# Patient Record
Sex: Female | Born: 1965 | Race: White | Hispanic: No | Smoking: Current every day smoker
Health system: Southern US, Community
[De-identification: ages and names within clinical notes are randomized; demographics above are authoritative.]

## PROBLEM LIST (undated history)

## (undated) DIAGNOSIS — I10 Essential (primary) hypertension: Secondary | ICD-10-CM

## (undated) DIAGNOSIS — G935 Compression of brain: Secondary | ICD-10-CM

## (undated) DIAGNOSIS — R519 Headache, unspecified: Secondary | ICD-10-CM

## (undated) DIAGNOSIS — F32A Depression, unspecified: Secondary | ICD-10-CM

## (undated) DIAGNOSIS — I2510 Atherosclerotic heart disease of native coronary artery without angina pectoris: Secondary | ICD-10-CM

## (undated) DIAGNOSIS — H9319 Tinnitus, unspecified ear: Secondary | ICD-10-CM

## (undated) DIAGNOSIS — F419 Anxiety disorder, unspecified: Secondary | ICD-10-CM

## (undated) HISTORY — PX: ABDOMINAL HYSTERECTOMY: SHX81

## (undated) HISTORY — PX: OTHER SURGICAL HISTORY: SHX169

## (undated) HISTORY — DX: Headache, unspecified: R51.9

## (undated) HISTORY — PX: BACK SURGERY: SHX140

## (undated) HISTORY — DX: Tinnitus, unspecified ear: H93.19

## (undated) HISTORY — PX: TONSILLECTOMY: SUR1361

## (undated) HISTORY — PX: CARPAL TUNNEL RELEASE: SHX101

## (undated) HISTORY — DX: Compression of brain: G93.5

---

## 1998-02-03 ENCOUNTER — Ambulatory Visit (HOSPITAL_COMMUNITY): Admission: RE | Admit: 1998-02-03 | Discharge: 1998-02-03 | Payer: Self-pay | Admitting: Gastroenterology

## 1998-08-21 ENCOUNTER — Other Ambulatory Visit: Admission: RE | Admit: 1998-08-21 | Discharge: 1998-08-21 | Payer: Self-pay | Admitting: Obstetrics and Gynecology

## 1998-09-12 ENCOUNTER — Ambulatory Visit (HOSPITAL_COMMUNITY): Admission: RE | Admit: 1998-09-12 | Discharge: 1998-09-12 | Payer: Self-pay | Admitting: Obstetrics and Gynecology

## 2000-01-13 ENCOUNTER — Emergency Department (HOSPITAL_COMMUNITY): Admission: EM | Admit: 2000-01-13 | Discharge: 2000-01-13 | Payer: Self-pay | Admitting: *Deleted

## 2000-01-13 ENCOUNTER — Encounter: Payer: Self-pay | Admitting: Emergency Medicine

## 2000-02-11 ENCOUNTER — Inpatient Hospital Stay (HOSPITAL_COMMUNITY): Admission: EM | Admit: 2000-02-11 | Discharge: 2000-02-14 | Payer: Self-pay | Admitting: Internal Medicine

## 2000-02-12 ENCOUNTER — Encounter: Payer: Self-pay | Admitting: Internal Medicine

## 2000-02-14 ENCOUNTER — Encounter: Payer: Self-pay | Admitting: Internal Medicine

## 2000-08-24 ENCOUNTER — Inpatient Hospital Stay (HOSPITAL_COMMUNITY): Admission: AD | Admit: 2000-08-24 | Discharge: 2000-08-24 | Payer: Self-pay | Admitting: Obstetrics

## 2000-10-03 ENCOUNTER — Other Ambulatory Visit: Admission: RE | Admit: 2000-10-03 | Discharge: 2000-10-03 | Payer: Self-pay | Admitting: Obstetrics and Gynecology

## 2000-10-07 ENCOUNTER — Encounter: Admission: RE | Admit: 2000-10-07 | Discharge: 2000-10-07 | Payer: Self-pay | Admitting: Obstetrics and Gynecology

## 2000-10-07 ENCOUNTER — Encounter: Payer: Self-pay | Admitting: Obstetrics and Gynecology

## 2017-07-01 ENCOUNTER — Encounter (HOSPITAL_COMMUNITY): Payer: Self-pay

## 2017-07-01 ENCOUNTER — Emergency Department (HOSPITAL_COMMUNITY)
Admission: EM | Admit: 2017-07-01 | Discharge: 2017-07-01 | Disposition: A | Discharge status: Home / Self Care | Payer: Self-pay | Attending: Emergency Medicine | Admitting: Emergency Medicine

## 2017-07-01 ENCOUNTER — Emergency Department (HOSPITAL_COMMUNITY): Payer: Self-pay

## 2017-07-01 ENCOUNTER — Other Ambulatory Visit: Payer: Self-pay

## 2017-07-01 DIAGNOSIS — I259 Chronic ischemic heart disease, unspecified: Secondary | ICD-10-CM | POA: Insufficient documentation

## 2017-07-01 DIAGNOSIS — Z79899 Other long term (current) drug therapy: Secondary | ICD-10-CM | POA: Insufficient documentation

## 2017-07-01 DIAGNOSIS — R079 Chest pain, unspecified: Secondary | ICD-10-CM | POA: Insufficient documentation

## 2017-07-01 DIAGNOSIS — I10 Essential (primary) hypertension: Secondary | ICD-10-CM | POA: Insufficient documentation

## 2017-07-01 DIAGNOSIS — Z87891 Personal history of nicotine dependence: Secondary | ICD-10-CM | POA: Insufficient documentation

## 2017-07-01 HISTORY — DX: Essential (primary) hypertension: I10

## 2017-07-01 HISTORY — DX: Atherosclerotic heart disease of native coronary artery without angina pectoris: I25.10

## 2017-07-01 LAB — CBC
HCT: 40.5 % (ref 36.0–46.0)
Hemoglobin: 13.3 g/dL (ref 12.0–15.0)
MCH: 32.4 pg (ref 26.0–34.0)
MCHC: 32.8 g/dL (ref 30.0–36.0)
MCV: 98.8 fL (ref 78.0–100.0)
Platelets: 212 10*3/uL (ref 150–400)
RBC: 4.1 MIL/uL (ref 3.87–5.11)
RDW: 12.9 % (ref 11.5–15.5)
WBC: 6.2 10*3/uL (ref 4.0–10.5)

## 2017-07-01 LAB — BASIC METABOLIC PANEL
Anion gap: 8 (ref 5–15)
BUN: 25 mg/dL — ABNORMAL HIGH (ref 6–20)
CO2: 23 mmol/L (ref 22–32)
Calcium: 9 mg/dL (ref 8.9–10.3)
Chloride: 107 mmol/L (ref 101–111)
Creatinine, Ser: 1.04 mg/dL — ABNORMAL HIGH (ref 0.44–1.00)
GFR calc Af Amer: >60 mL/min (ref >60)
GFR calc non Af Amer: >60 mL/min (ref >60)
Glucose, Bld: 94 mg/dL (ref 65–99)
Potassium: 4.1 mmol/L (ref 3.5–5.1)
Sodium: 138 mmol/L (ref 135–145)

## 2017-07-01 LAB — I-STAT TROPONIN, ED: Troponin i, poc: 0.01 ng/mL (ref 0.00–0.08)

## 2017-07-01 LAB — TROPONIN I: Troponin I: <0.03 ng/mL (ref <0.03)

## 2017-07-01 MED ORDER — IBUPROFEN 400 MG PO TABS
600.0000 mg | ORAL_TABLET | Freq: Once | ORAL | Status: AC
  Administered 2017-07-01: 600 mg via ORAL
  Filled 2017-07-01: qty 1

## 2017-07-01 MED ORDER — SODIUM CHLORIDE 0.9 % IV BOLUS (SEPSIS)
1000.0000 mL | Freq: Once | INTRAVENOUS | Status: AC
  Administered 2017-07-01: 1000 mL via INTRAVENOUS

## 2017-07-01 MED ORDER — METOCLOPRAMIDE HCL 5 MG/ML IJ SOLN
10.0000 mg | Freq: Once | INTRAMUSCULAR | Status: AC
  Administered 2017-07-01: 10 mg via INTRAVENOUS
  Filled 2017-07-01: qty 2

## 2017-07-01 MED ORDER — DIPHENHYDRAMINE HCL 50 MG/ML IJ SOLN
25.0000 mg | Freq: Once | INTRAMUSCULAR | Status: AC
  Administered 2017-07-01: 25 mg via INTRAVENOUS
  Filled 2017-07-01: qty 1

## 2017-07-01 NOTE — ED Provider Notes (Signed)
[facility] Provider Note   CSN: [id number] Arrival date & time: 07/01/17  1641     History   Chief Complaint Chief Complaint  Patient presents with  . Chest Pain    HPI Autumn Greene is a 51 y.o. female.  HPI   51 year old female with chest pain.  She has has this pain intermittently.  To be less the pain when she wakes up.  Long the pain subsides throughout the morning she goes to her typical routine.  Today it did not so she presented for evaluation.  Describes an ache in the center of her chest.  She denies any other associated symptoms such as dyspnea, diaphoresis dizziness.  Pain does not radiate.  No change with exertion.  No unusual leg pain or swelling.  Past Medical History:  Diagnosis Date  . Coronary artery disease   . Hypertension     There are no active problems to display for this patient.   Past Surgical History:  Procedure Laterality Date  . BACK SURGERY      OB History    No data available       Home Medications    Prior to Admission medications   Medication Sig Start Date End Date Taking? Authorizing Provider  buPROPion (WELLBUTRIN XL) 300 MG 24 hr tablet Take 300 mg by mouth daily. 04/16/17  Yes [provider]  estradiol (ESTRACE) 1 MG tablet Take 1.5 mg by mouth daily. 03/28/17  Yes [provider]  gabapentin (NEURONTIN) 600 MG tablet Take 600 mg by mouth 3 (three) times daily.   Yes [provider]  losartan (COZAAR) 50 MG tablet Take 50 mg by mouth every evening. 05/31/17  Yes [provider]  Menthol, Topical Analgesic, (BIOFREEZE ROLL-ON EX) Apply 1 application topically 3 (three) times daily as needed (wrist pain).   Yes [provider]  Multiple Vitamin (MULTIVITAMIN WITH MINERALS) TABS tablet Take 1 tablet by mouth daily.   Yes [provider]  Oxycodone HCl 10 MG TABS Take 10 mg by mouth every 8 (eight) hours. 06/02/17  Yes [provider]  propranolol (INDERAL) 80 MG tablet Take 80 mg by mouth 2 (two) times daily. 04/26/17  Yes [provider]  tiZANidine (ZANAFLEX) 4 MG tablet Take 4 mg by mouth daily. 04/07/17  Yes [provider]  zolpidem (AMBIEN) 10 MG tablet Take 5 mg by mouth daily.   Yes [provider]    Family History History reviewed. No pertinent family history.  Social History Social History   Tobacco Use  . Smoking status: Former Smoker    Types: Cigars  . Smokeless tobacco: Never Used  Substance Use Topics  . Alcohol use: Yes  . Drug use: No     Allergies   Lisinopril and Sulfa antibiotics   Review of Systems Review of Systems  All systems reviewed and negative, other than as noted in HPI.  Physical Exam Updated Vital Signs BP (!) 147/70   Pulse 68   Temp 98.4 F (36.9 C) (Oral)   Resp 19   Ht 4\' 11"  (1.499 m)   Wt 79.4 kg (175 lb)   SpO2 96%   BMI 35.35 kg/m   Physical Exam  Constitutional: She appears well-developed and well-nourished. No distress.  HENT:  Head: Normocephalic and atraumatic.  Eyes: Conjunctivae are normal. Right eye exhibits no discharge. Left eye exhibits no discharge.  Neck: Neck supple.  Cardiovascular: Normal rate, regular rhythm and  normal heart sounds. Exam reveals no gallop and no friction rub.  No murmur heard. Pulmonary/Chest: Effort normal and breath sounds normal. No respiratory distress.  Abdominal: Soft. She exhibits no distension. There is no tenderness.  Musculoskeletal: She exhibits no edema or tenderness.  Lower extremities symmetric as compared to each other. No calf tenderness. Negative Homan's. No palpable cords.   Neurological: She is alert.  Skin: Skin is warm and dry.  Psychiatric: She has a normal mood and affect. Her behavior is normal. Thought content normal.  Nursing note and vitals reviewed.    ED Treatments / Results  Labs (all labs ordered are listed, but only abnormal results are  displayed) Labs Reviewed  BASIC METABOLIC PANEL - Abnormal; Notable for the following components:      Result Value   BUN 25 (*)    Creatinine, Ser 1.04 (*)    All other components within normal limits  CBC  TROPONIN I  I-STAT TROPONIN, ED  I-STAT BETA HCG BLOOD, ED ([facility], [facility], [facility] ONLY)    EKG  EKG Interpretation  Date/Time:  Friday July 01 2017 16:49:35 EST Ventricular Rate:  68 PR Interval:    QRS Duration: 85 QT Interval:  415 QTC Calculation: 442 R Axis:   -12 Text Interpretation:  Sinus rhythm Left atrial enlargement Low voltage, precordial leads Probable left ventricular hypertrophy Confirmed by Raeford RazorKohut, Agostino Gorin [phone]([id number]) on 07/01/2017 5:12:22 PM       Radiology Dg Chest 2 View  Result Date: 07/01/2017 CLINICAL DATA:  Chest pain EXAM: CHEST  2 VIEW COMPARISON:  None. FINDINGS: The heart size and mediastinal contours are within normal limits. Both lungs are clear. The visualized skeletal structures are unremarkable. IMPRESSION: No active cardiopulmonary disease. Electronically Signed   By: Deatra RobinsonKevin  Herman M.D.   On: 07/01/2017 17:56    Procedures Procedures (including critical care time)  Medications Ordered in ED Medications  ibuprofen (ADVIL,MOTRIN) tablet 600 mg (600 mg Oral Given 07/01/17 1900)  metoCLOPramide (REGLAN) injection 10 mg (10 mg Intravenous Given 07/01/17 2002)  diphenhydrAMINE (BENADRYL) injection 25 mg (25 mg Intravenous Given 07/01/17 2001)  sodium chloride 0.9 % bolus 1,000 mL (0 mLs Intravenous Stopped 07/01/17 2104)     Initial Impression / Assessment and Plan / ED Course  I have reviewed the triage vital signs and the nursing notes.  Pertinent labs & imaging results that were available during my care of the patient were reviewed by me and considered in my medical decision making (see chart for details).  51 year old female with chest pain.  Seems atypical for ACS.  EKG without overt ischemic changes.  Doubt PE, dissection or other emergent  process.  Final Clinical Impressions(s) / ED Diagnoses   Final diagnoses:  Chest pain, unspecified type    ED Discharge Orders    None       Raeford RazorKohut, [place], MD 07/17/17 [phone]

## 2017-07-01 NOTE — ED Notes (Signed)
Patient transported to X-ray 

## 2017-07-01 NOTE — ED Triage Notes (Signed)
Pt from home for Chest pain after waking up. Pt has Cp on a regular basis when waking up. Pt states it normally goes away but this time it did not. Pt has had of fluid by EMS and pt dizziness then improved

## 2017-07-05 LAB — I-STAT BETA HCG BLOOD, ED (MC, WL, AP ONLY): I-stat hCG, quantitative: <5 m[IU]/mL (ref <5)

## 2019-12-25 ENCOUNTER — Other Ambulatory Visit: Payer: Self-pay | Admitting: Physician Assistant

## 2019-12-25 ENCOUNTER — Ambulatory Visit
Admission: RE | Admit: 2019-12-25 | Discharge: 2019-12-25 | Disposition: A | Discharge status: Home / Self Care | Payer: 59 | Source: Ambulatory Visit | Attending: Physician Assistant | Admitting: Physician Assistant

## 2019-12-25 DIAGNOSIS — F17200 Nicotine dependence, unspecified, uncomplicated: Secondary | ICD-10-CM

## 2019-12-25 DIAGNOSIS — R0602 Shortness of breath: Secondary | ICD-10-CM

## 2019-12-28 ENCOUNTER — Other Ambulatory Visit (HOSPITAL_COMMUNITY): Payer: Self-pay | Admitting: Respiratory Therapy

## 2019-12-28 DIAGNOSIS — F17200 Nicotine dependence, unspecified, uncomplicated: Secondary | ICD-10-CM

## 2019-12-28 DIAGNOSIS — R0602 Shortness of breath: Secondary | ICD-10-CM

## 2020-01-08 ENCOUNTER — Other Ambulatory Visit (HOSPITAL_COMMUNITY)
Admission: RE | Admit: 2020-01-08 | Discharge: 2020-01-08 | Disposition: A | Discharge status: Home / Self Care | Payer: 59 | Source: Ambulatory Visit | Attending: Physician Assistant | Admitting: Physician Assistant

## 2020-01-08 DIAGNOSIS — Z01812 Encounter for preprocedural laboratory examination: Secondary | ICD-10-CM | POA: Insufficient documentation

## 2020-01-08 DIAGNOSIS — Z20822 Contact with and (suspected) exposure to covid-19: Secondary | ICD-10-CM | POA: Insufficient documentation

## 2020-01-08 LAB — SARS CORONAVIRUS 2 (TAT 6-24 HRS): SARS Coronavirus 2: NEGATIVE

## 2020-01-10 ENCOUNTER — Other Ambulatory Visit: Payer: Self-pay

## 2020-01-10 ENCOUNTER — Ambulatory Visit (HOSPITAL_COMMUNITY)
Admission: RE | Admit: 2020-01-10 | Discharge: 2020-01-10 | Disposition: A | Discharge status: Home / Self Care | Payer: 59 | Source: Ambulatory Visit | Attending: Physician Assistant | Admitting: Physician Assistant

## 2020-01-10 MED ORDER — ALBUTEROL SULFATE (2.5 MG/3ML) 0.083% IN NEBU: 2.5000 mg | INHALATION_SOLUTION | Freq: Once | RESPIRATORY_TRACT | Status: DC

## 2020-01-31 ENCOUNTER — Inpatient Hospital Stay (HOSPITAL_COMMUNITY): Admission: RE | Admit: 2020-01-31 | Payer: 59 | Source: Ambulatory Visit

## 2020-01-31 ENCOUNTER — Encounter (HOSPITAL_COMMUNITY): Payer: Self-pay

## 2020-03-10 ENCOUNTER — Ambulatory Visit: Payer: 59 | Admitting: Adult Health

## 2020-03-31 ENCOUNTER — Ambulatory Visit: Payer: 59 | Admitting: Adult Health

## 2020-04-24 ENCOUNTER — Ambulatory Visit: Payer: 59 | Admitting: Neurology

## 2020-04-24 ENCOUNTER — Other Ambulatory Visit: Payer: Self-pay

## 2020-04-29 ENCOUNTER — Encounter: Payer: Self-pay | Admitting: Adult Health

## 2020-04-29 ENCOUNTER — Other Ambulatory Visit: Payer: Self-pay

## 2020-04-29 ENCOUNTER — Ambulatory Visit (INDEPENDENT_AMBULATORY_CARE_PROVIDER_SITE_OTHER): Payer: 59 | Admitting: Adult Health

## 2020-04-29 VITALS — BP 158/99 | HR 73 | Ht 60.0 in | Wt 167.0 lb

## 2020-04-29 DIAGNOSIS — F411 Generalized anxiety disorder: Secondary | ICD-10-CM | POA: Diagnosis not present

## 2020-04-29 DIAGNOSIS — F331 Major depressive disorder, recurrent, moderate: Secondary | ICD-10-CM | POA: Diagnosis not present

## 2020-04-29 DIAGNOSIS — G4700 Insomnia, unspecified: Secondary | ICD-10-CM

## 2020-04-29 DIAGNOSIS — F428 Other obsessive-compulsive disorder: Secondary | ICD-10-CM

## 2020-04-29 DIAGNOSIS — F4010 Social phobia, unspecified: Secondary | ICD-10-CM | POA: Diagnosis not present

## 2020-04-29 MED ORDER — BUPROPION HCL ER (XL) 300 MG PO TB24: 300.0000 mg | ORAL_TABLET | Freq: Every day | ORAL | 2 refills | Status: DC

## 2020-04-29 MED ORDER — DULOXETINE HCL 30 MG PO CPEP: 30.0000 mg | ORAL_CAPSULE | Freq: Every day | ORAL | 2 refills | Status: DC

## 2020-04-29 MED ORDER — ZOLPIDEM TARTRATE 10 MG PO TABS: 10.0000 mg | ORAL_TABLET | Freq: Every day | ORAL | 2 refills | Status: DC

## 2020-04-29 MED ORDER — BUPROPION HCL ER (XL) 150 MG PO TB24: 150.0000 mg | ORAL_TABLET | Freq: Every day | ORAL | 2 refills | Status: DC
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
## 2020-05-08 ENCOUNTER — Other Ambulatory Visit: Payer: Self-pay | Admitting: Physician Assistant

## 2020-05-08 DIAGNOSIS — Z1231 Encounter for screening mammogram for malignant neoplasm of breast: Secondary | ICD-10-CM

## 2020-05-27 ENCOUNTER — Telehealth (INDEPENDENT_AMBULATORY_CARE_PROVIDER_SITE_OTHER): Payer: 59 | Admitting: Adult Health

## 2020-05-27 ENCOUNTER — Encounter: Payer: Self-pay | Admitting: Adult Health

## 2020-05-27 DIAGNOSIS — F4010 Social phobia, unspecified: Secondary | ICD-10-CM | POA: Diagnosis not present

## 2020-05-27 DIAGNOSIS — F411 Generalized anxiety disorder: Secondary | ICD-10-CM

## 2020-05-27 DIAGNOSIS — F428 Other obsessive-compulsive disorder: Secondary | ICD-10-CM | POA: Diagnosis not present

## 2020-05-27 DIAGNOSIS — G4700 Insomnia, unspecified: Secondary | ICD-10-CM

## 2020-05-27 DIAGNOSIS — F331 Major depressive disorder, recurrent, moderate: Secondary | ICD-10-CM | POA: Diagnosis not present

## 2020-05-27 MED ORDER — DULOXETINE HCL 60 MG PO CPEP: 60.0000 mg | ORAL_CAPSULE | Freq: Every day | ORAL | 2 refills | Status: DC

## 2020-05-27 NOTE — Progress Notes (Addendum)
Autumn Greene [id number] Nov 16, 1965 54 y.o.  Virtual Visit via Video Note  I connected with pt @ on 05/27/20 at  3:40 PM EST by a video enabled telemedicine application and verified that I am speaking with the correct person using two identifiers.   I discussed the limitations of evaluation and management by telemedicine and the availability of in person appointments. The patient expressed understanding and agreed to proceed.  I discussed the assessment and treatment plan with the patient. The patient was provided an opportunity to ask questions and all were answered. The patient agreed with the plan and demonstrated an understanding of the instructions.   The patient was advised to call back or seek an in-person evaluation if the symptoms worsen or if the condition fails to improve as anticipated.  I provided 30 minutes of non-face-to-face time during this encounter.  The patient was located at home.  The provider was located at [facility].   Dorothyann Gibbs, NP   Subjective:   Patient ID:  Autumn Greene is a 54 y.o. (DOB 1966-02-19) female.  Chief Complaint: No chief complaint on file.   HPI Autumn Greene presents for follow-up of MDD, GAD, SAD, insomnia, and Obsessional thoughts.   Describes mood today as "bettert". Pleasant. Mood symptoms - reports decreased depression, anxiety, and irritability. Feels like increase in Wellbutrin has been helpful. Not feeling as low. Able to push through things a little easier. Has started painting - painting pictures for the family for Christmas. Stating "I love to paint". Worries about relationship with son. Stating "I have racing thoughts about it". He want answer phone or talk to her. Is not able to see or communicate with grandchildren. She and fiance are getting along well. Trying to work on their relationship. Would like to see a couples therapist. Has started individual therapy and has had 3 session. Improved  interest and motivation. Still not wanting to get out and do things. Taking medications as prescribed.  Energy levels improved. Active, does not have a regular exercise routine. Walking with dog.  Enjoys some usual interests and activities. Lives with fiance. Spending time with family. Has 4 sons - 29, 30, 38, and 28. Appetite adequate. Weight gain 3 pounds since last visit - 170 pounds..  Sleeps well most nights. Averages 10 hours. Not feeling as tired when getting up.  Focus and concentration difficulties - "better when painting.. Completing tasks. Managing aspects of household. 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 Denies SI or HI.  Denies AH or VH.   Review of Systems:  Review of Systems  Musculoskeletal: Negative for gait problem.  Neurological: Negative for tremors.  Psychiatric/Behavioral:       Please refer to HPI    Medications: I have reviewed the patient's current medications.  Current Outpatient Medications  Medication Sig Dispense Refill  . buPROPion (WELLBUTRIN XL) 150 MG 24 hr tablet Take 1 tablet (150 mg total) by mouth daily. 30 tablet 2  . buPROPion (WELLBUTRIN XL) 300 MG 24 hr tablet Take 1 tablet (300 mg total) by mouth daily. 30 tablet 2  . DULoxetine (CYMBALTA) 60 MG capsule Take 1 capsule (60 mg total) by mouth daily. 30 capsule 2  . estradiol (ESTRACE) 1 MG tablet Take 1.5 mg by mouth daily.  0  . gabapentin (NEURONTIN) 600 MG tablet Take 600 mg by mouth 3 (three) times daily.    Marland Kitchen losartan (COZAAR) 50 MG tablet Take 50 mg by mouth  every evening.  0  . Menthol, Topical Analgesic, (BIOFREEZE ROLL-ON EX) Apply 1 application topically 3 (three) times daily as needed (wrist pain).    . Multiple Vitamin (MULTIVITAMIN WITH MINERALS) TABS tablet Take 1 tablet by mouth daily.    . Oxycodone HCl 10 MG TABS Take 10 mg by mouth every 8 (eight) hours.  0  . propranolol (INDERAL) 80 MG tablet Take 80 mg by mouth 2 (two) times daily.  2  .  tiZANidine (ZANAFLEX) 4 MG tablet Take 4 mg by mouth daily.  2  . zolpidem (AMBIEN) 10 MG tablet Take 1 tablet (10 mg total) by mouth at bedtime. 30 tablet 2   No current facility-administered medications for this visit.    Medication Side Effects: None  Allergies:  Allergies  Allergen Reactions  . Lisinopril Anaphylaxis, Rash and Swelling  . Oxycodone-Acetaminophen Nausea And Vomiting    STOMACH ON FIRE , HURTING  . Sulfa Antibiotics Hives  . Sulfamethoxazole Rash    Past Medical History:  Diagnosis Date  . Coronary artery disease   . Hypertension     No family history on file.  Social History   Socioeconomic History  . Marital status: Legally Separated    Spouse name: Not on file  . Number of children: Not on file  . Years of education: Not on file  . Highest education level: Not on file  Occupational History  . Not on file  Tobacco Use  . Smoking status: Current Every Day Smoker    Types: Cigars  . Smokeless tobacco: Never Used  Vaping Use  . Vaping Use: Every day  Substance and Sexual Activity  . Alcohol use: Yes  . Drug use: No  . Sexual activity: Yes  Other Topics Concern  . Not on file  Social History Narrative  . Not on file   Social Determinants of Health   Financial Resource Strain:   . Difficulty of Paying Living Expenses: Not on file  Food Insecurity:   . Worried About Programme researcher, broadcasting/film/video in the Last Year: Not on file  . Ran Out of Food in the Last Year: Not on file  Transportation Needs:   . Lack of Transportation (Medical): Not on file  . Lack of Transportation (Non-Medical): Not on file  Physical Activity:   . Days of Exercise per Week: Not on file  . Minutes of Exercise per Session: Not on file  Stress:   . Feeling of Stress : Not on file  Social Connections:   . Frequency of Communication with Friends and Family: Not on file  . Frequency of Social Gatherings with Friends and Family: Not on file  . Attends Religious Services: Not on  file  . Active Member of Clubs or Organizations: Not on file  . Attends Banker Meetings: Not on file  . Marital Status: Not on file  Intimate Partner Violence:   . Fear of Current or Ex-Partner: Not on file  . Emotionally Abused: Not on file  . Physically Abused: Not on file  . Sexually Abused: Not on file    Past Medical History, Surgical history, Social history, and Family history were reviewed and updated as appropriate.   Please see review of systems for further details on the patient's review from today.   Objective:   Physical Exam:  There were no vitals taken for this visit.  Physical Exam Constitutional:      General: She is not in acute distress. Musculoskeletal:  General: No deformity.  Neurological:     Mental Status: She is alert and oriented to person, place, and time.     Coordination: Coordination normal.  Psychiatric:        Attention and Perception: Attention and perception normal. She does not perceive auditory or visual hallucinations.        Mood and Affect: Mood normal. Mood is not anxious or depressed. Affect is not labile, blunt, angry or inappropriate.        Speech: Speech normal.        Behavior: Behavior normal.        Thought Content: Thought content normal. Thought content is not paranoid or delusional. Thought content does not include homicidal or suicidal ideation. Thought content does not include homicidal or suicidal plan.        Cognition and Memory: Cognition and memory normal.        Judgment: Judgment normal.     Comments: Insight intact     Lab Review:     Component Value Date/Time   NA 138 07/01/2017 1710   K 4.1 07/01/2017 1710   CL 107 07/01/2017 1710   CO2 23 07/01/2017 1710   GLUCOSE 94 07/01/2017 1710   BUN 25 (H) 07/01/2017 1710   CREATININE 1.04 (H) 07/01/2017 1710   CALCIUM 9.0 07/01/2017 1710   GFRNONAA >60 07/01/2017 1710   GFRAA >60 07/01/2017 1710       Component Value Date/Time   WBC 6.2  07/01/2017 1710   RBC 4.10 07/01/2017 1710   HGB 13.3 07/01/2017 1710   HCT 40.5 07/01/2017 1710   PLT 212 07/01/2017 1710   MCV 98.8 07/01/2017 1710   MCH 32.4 07/01/2017 1710   MCHC 32.8 07/01/2017 1710   RDW 12.9 07/01/2017 1710    No results found for: POCLITH, LITHIUM   No results found for: PHENYTOIN, PHENOBARB, VALPROATE, CBMZ   .res Assessment: Plan:    Plan:  PDMP reviewed  1. Increase Cymbalta 30mg  to 60mg  daily 2. Wellbutrin XL 300mg  daily - denies seizure history 3. Ambien 10mg  daily  Set up with a therapist - [place].  Read and reviewed note with patient for accuracy.   RTC 4 weeks  Patient advised to contact office with any questions, adverse effects, or acute worsening in signs and symptoms.    Diagnoses and all orders for this visit:  Major depressive disorder, recurrent episode, moderate (HCC) -     DULoxetine (CYMBALTA) 60 MG capsule; Take 1 capsule (60 mg total) by mouth daily.  Generalized anxiety disorder -     DULoxetine (CYMBALTA) 60 MG capsule; Take 1 capsule (60 mg total) by mouth daily.  Obsessional thoughts  Social anxiety disorder  Insomnia, unspecified type     Please see After Visit Summary for patient specific instructions.  Future Appointments  Date Time Provider Department Center  06/16/2020  4:20 PM [facility]  07/03/2020  2:30 PM , MD [facility] None    No orders of the defined types were placed in this encounter.     -------------------------------

## 2020-06-16 ENCOUNTER — Ambulatory Visit: Payer: 59

## 2020-06-24 ENCOUNTER — Other Ambulatory Visit: Payer: Self-pay

## 2020-06-24 ENCOUNTER — Encounter: Payer: Self-pay | Admitting: Adult Health

## 2020-06-24 ENCOUNTER — Ambulatory Visit (INDEPENDENT_AMBULATORY_CARE_PROVIDER_SITE_OTHER): Payer: 59 | Admitting: Adult Health

## 2020-06-24 DIAGNOSIS — F4010 Social phobia, unspecified: Secondary | ICD-10-CM | POA: Diagnosis not present

## 2020-06-24 DIAGNOSIS — G4700 Insomnia, unspecified: Secondary | ICD-10-CM

## 2020-06-24 DIAGNOSIS — F331 Major depressive disorder, recurrent, moderate: Secondary | ICD-10-CM

## 2020-06-24 DIAGNOSIS — F411 Generalized anxiety disorder: Secondary | ICD-10-CM

## 2020-06-24 MED ORDER — BUPROPION HCL ER (XL) 300 MG PO TB24: 300.0000 mg | ORAL_TABLET | Freq: Every day | ORAL | 2 refills | Status: DC

## 2020-06-24 MED ORDER — BUPROPION HCL ER (XL) 150 MG PO TB24: 150.0000 mg | ORAL_TABLET | Freq: Every day | ORAL | 2 refills | Status: DC

## 2020-06-24 MED ORDER — ZOLPIDEM TARTRATE ER 12.5 MG PO TBCR: 12.5000 mg | EXTENDED_RELEASE_TABLET | Freq: Every evening | ORAL | 2 refills | Status: DC | PRN

## 2020-06-24 MED ORDER — DULOXETINE HCL 60 MG PO CPEP: 60.0000 mg | ORAL_CAPSULE | Freq: Every day | ORAL | 2 refills | Status: DC

## 2020-06-24 NOTE — Progress Notes (Signed)
Autumn Greene [id number] 12-12-1965 54 y.o.  Subjective:   Patient ID:  Autumn Greene is a 55 y.o. (DOB 07-13-1966) female.  Chief Complaint: No chief complaint on file.   HPI Autumn Greene presents to the office today for follow-up of MDD, GAD, SAD, insomnia, and Obsessional thoughts.   Describes mood today as "better". Pleasant. Mood symptoms - reports decreased depression, anxiety, and irritability. Stating "all in all, the changes have agreed with me". Still not wanting to be around people. Doesn't like people talking to her. Can't go to church. Concerned about loss of sleep - Ambien not working as well. Seeing therapist. Improved interest and motivation. Taking medications as prescribed.  Energy levels "ok" - has "bursts". Active, does not have a regular exercise routine. Walking with dog.  Enjoys some usual interests and activities. Lives with fiance. Spending time with family. Has 4 sons - 64, 30, 52, and 57. Appetite adequate. Weight stable - 170 pounds.  Sleeps better some nights than others. Averages 8 to 9 hours.   Focus and concentration difficulties. Completing tasks. Managing aspects of household - "the house is wreck". 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 Denies SI or HI.  Denies AH or VH.     Review of Systems:  Review of Systems  Musculoskeletal: Negative for gait problem.  Neurological: Negative for tremors.  Psychiatric/Behavioral:       Please refer to HPI    Medications: I have reviewed the patient's current medications.  Current Outpatient Medications  Medication Sig Dispense Refill  . buPROPion (WELLBUTRIN XL) 150 MG 24 hr tablet Take 1 tablet (150 mg total) by mouth daily. 30 tablet 2  . buPROPion (WELLBUTRIN XL) 300 MG 24 hr tablet Take 1 tablet (300 mg total) by mouth daily. 30 tablet 2  . DULoxetine (CYMBALTA) 60 MG capsule Take 1 capsule (60 mg total) by mouth daily. 30 capsule 2  . estradiol (ESTRACE) 1 MG  tablet Take 1.5 mg by mouth daily.  0  . gabapentin (NEURONTIN) 600 MG tablet Take 600 mg by mouth 3 (three) times daily.    Marland Kitchen losartan (COZAAR) 50 MG tablet Take 50 mg by mouth every evening.  0  . Menthol, Topical Analgesic, (BIOFREEZE ROLL-ON EX) Apply 1 application topically 3 (three) times daily as needed (wrist pain).    . Multiple Vitamin (MULTIVITAMIN WITH MINERALS) TABS tablet Take 1 tablet by mouth daily.    . Oxycodone HCl 10 MG TABS Take 10 mg by mouth every 8 (eight) hours.  0  . propranolol (INDERAL) 80 MG tablet Take 80 mg by mouth 2 (two) times daily.  2  . tiZANidine (ZANAFLEX) 4 MG tablet Take 4 mg by mouth daily.  2  . zolpidem (AMBIEN CR) 12.5 MG CR tablet Take 1 tablet (12.5 mg total) by mouth at bedtime as needed for sleep. 30 tablet 2   No current facility-administered medications for this visit.    Medication Side Effects: None  Allergies:  Allergies  Allergen Reactions  . Lisinopril Anaphylaxis, Rash and Swelling  . Oxycodone-Acetaminophen Nausea And Vomiting    STOMACH ON FIRE , HURTING  . Sulfa Antibiotics Hives  . Sulfamethoxazole Rash    Past Medical History:  Diagnosis Date  . Coronary artery disease   . Hypertension     No family history on file.  Social History   Socioeconomic History  . Marital status: Legally Separated    Spouse name: Not on file  .  Number of children: Not on file  . Years of education: Not on file  . Highest education level: Not on file  Occupational History  . Not on file  Tobacco Use  . Smoking status: Current Every Day Smoker    Types: Cigars  . Smokeless tobacco: Never Used  Vaping Use  . Vaping Use: Every day  Substance and Sexual Activity  . Alcohol use: Yes  . Drug use: No  . Sexual activity: Yes  Other Topics Concern  . Not on file  Social History Narrative  . Not on file   Social Determinants of Health   Financial Resource Strain:   . Difficulty of Paying Living Expenses: Not on file  Food  Insecurity:   . Worried About Programme researcher, broadcasting/film/video in the Last Year: Not on file  . Ran Out of Food in the Last Year: Not on file  Transportation Needs:   . Lack of Transportation (Medical): Not on file  . Lack of Transportation (Non-Medical): Not on file  Physical Activity:   . Days of Exercise per Week: Not on file  . Minutes of Exercise per Session: Not on file  Stress:   . Feeling of Stress : Not on file  Social Connections:   . Frequency of Communication with Friends and Family: Not on file  . Frequency of Social Gatherings with Friends and Family: Not on file  . Attends Religious Services: Not on file  . Active Member of Clubs or Organizations: Not on file  . Attends Banker Meetings: Not on file  . Marital Status: Not on file  Intimate Partner Violence:   . Fear of Current or Ex-Partner: Not on file  . Emotionally Abused: Not on file  . Physically Abused: Not on file  . Sexually Abused: Not on file    Past Medical History, Surgical history, Social history, and Family history were reviewed and updated as appropriate.   Please see review of systems for further details on the patient's review from today.   Objective:   Physical Exam:  There were no vitals taken for this visit.  Physical Exam Constitutional:      General: She is not in acute distress. Musculoskeletal:        General: No deformity.  Neurological:     Mental Status: She is alert and oriented to person, place, and time.     Coordination: Coordination normal.  Psychiatric:        Attention and Perception: Attention and perception normal. She does not perceive auditory or visual hallucinations.        Mood and Affect: Mood normal. Mood is not anxious or depressed. Affect is not labile, blunt, angry or inappropriate.        Speech: Speech normal.        Behavior: Behavior normal.        Thought Content: Thought content normal. Thought content is not paranoid or delusional. Thought content does  not include homicidal or suicidal ideation. Thought content does not include homicidal or suicidal plan.        Cognition and Memory: Cognition and memory normal.        Judgment: Judgment normal.     Comments: Insight intact     Lab Review:     Component Value Date/Time   NA 138 07/01/2017 1710   K 4.1 07/01/2017 1710   CL 107 07/01/2017 1710   CO2 23 07/01/2017 1710   GLUCOSE 94 07/01/2017 1710  BUN 25 (H) 07/01/2017 1710   CREATININE 1.04 (H) 07/01/2017 1710   CALCIUM 9.0 07/01/2017 1710   GFRNONAA >60 07/01/2017 1710   GFRAA >60 07/01/2017 1710       Component Value Date/Time   WBC 6.2 07/01/2017 1710   RBC 4.10 07/01/2017 1710   HGB 13.3 07/01/2017 1710   HCT 40.5 07/01/2017 1710   PLT 212 07/01/2017 1710   MCV 98.8 07/01/2017 1710   MCH 32.4 07/01/2017 1710   MCHC 32.8 07/01/2017 1710   RDW 12.9 07/01/2017 1710    No results found for: POCLITH, LITHIUM   No results found for: PHENYTOIN, PHENOBARB, VALPROATE, CBMZ   .res Assessment: Plan:    Plan:  PDMP reviewed  1. Cymbalta 60mg  daily 2. Wellbutrin XL 450mg  daily - denies seizure history 3. D/C Ambien 10mg  daily 4. Add Ambien CR 12.5mg  at hs  Set up with a therapist - [place].  Read and reviewed note with patient for accuracy.   RTC 4 weeks  Patient advised to contact office with any questions, adverse effects, or acute worsening in signs and symptoms.   Diagnoses and all orders for this visit:  Social anxiety disorder  Major depressive disorder, recurrent episode, moderate (HCC) -     buPROPion (WELLBUTRIN XL) 300 MG 24 hr tablet; Take 1 tablet (300 mg total) by mouth daily. -     DULoxetine (CYMBALTA) 60 MG capsule; Take 1 capsule (60 mg total) by mouth daily. -     buPROPion (WELLBUTRIN XL) 150 MG 24 hr tablet; Take 1 tablet (150 mg total) by mouth daily.  Generalized anxiety disorder -     DULoxetine (CYMBALTA) 60 MG capsule; Take 1 capsule (60 mg total) by mouth daily.  Insomnia,  unspecified type -     zolpidem (AMBIEN CR) 12.5 MG CR tablet; Take 1 tablet (12.5 mg total) by mouth at bedtime as needed for sleep.     Please see After Visit Summary for patient specific instructions.  Future Appointments  Date Time Provider Department Center  07/22/2020  9:30 AM , MD [facility] None  07/25/2020  4:30 PM [facility]    No orders of the defined types were placed in this encounter.   -------------------------------

## 2020-06-30 ENCOUNTER — Ambulatory Visit
Admission: RE | Admit: 2020-06-30 | Discharge: 2020-06-30 | Disposition: A | Discharge status: Home / Self Care | Payer: 59 | Source: Ambulatory Visit | Attending: Physician Assistant | Admitting: Physician Assistant

## 2020-06-30 ENCOUNTER — Other Ambulatory Visit: Payer: Self-pay | Admitting: Physician Assistant

## 2020-06-30 DIAGNOSIS — R042 Hemoptysis: Secondary | ICD-10-CM

## 2020-07-01 ENCOUNTER — Ambulatory Visit: Payer: 59 | Admitting: Neurology

## 2020-07-01 ENCOUNTER — Encounter: Payer: Self-pay | Admitting: Neurology

## 2020-07-01 ENCOUNTER — Other Ambulatory Visit: Payer: Self-pay

## 2020-07-01 VITALS — BP 136/93 | HR 60 | Ht 60.0 in | Wt 175.0 lb

## 2020-07-01 DIAGNOSIS — R519 Headache, unspecified: Secondary | ICD-10-CM | POA: Diagnosis not present

## 2020-07-01 DIAGNOSIS — M542 Cervicalgia: Secondary | ICD-10-CM | POA: Insufficient documentation

## 2020-07-01 DIAGNOSIS — G8929 Other chronic pain: Secondary | ICD-10-CM | POA: Insufficient documentation

## 2020-07-01 MED ORDER — ONDANSETRON 4 MG PO TBDP: 4.0000 mg | ORAL_TABLET | Freq: Three times a day (TID) | ORAL | 6 refills | Status: DC | PRN

## 2020-07-01 MED ORDER — SUMATRIPTAN SUCCINATE 50 MG PO TABS: 50.0000 mg | ORAL_TABLET | ORAL | 6 refills | Status: AC | PRN

## 2020-07-01 MED ORDER — ALPRAZOLAM 1 MG PO TABS: ORAL_TABLET | ORAL | 0 refills | Status: DC

## 2020-07-01 NOTE — Progress Notes (Signed)
Chief Complaint  Patient presents with  . New Patient (Initial Visit)    She is here to establish new care. History of decompression of chiari malformation by Dr. Benetta Spar Neave (09/2009). Reports having intermittent head pains that feels like a "red, hot, poker" sticking her in the head. The pain is sudden and only last for few seconds at a time.     HISTORICAL  Autumn Greene is a 54 year old female, seen in request by her primary care PA [place], [place] for evaluation of frequent headaches, history of Arnold-Chiari malformation, initial evaluation was on July 01, 2020.  I reviewed and summarized the referring note.  Past medical history Hypertension Hyperlipidemia Bipolar disorder, wellbutrin xl 450mg , cymbalta 60mg  daily  She reported a history of Arnold-Chiari malformation, presented with frequent neck pain, balance issues, mental confusion, radiating pain to bilateral shoulders, diagnosis was confirmed by imaging study, she had a posterior craniotomy for decompression in 2011 by Dr. , she reported significant improvement  Overall 2021, she began to have frequent neck pain, felt neck popping while moving her neck, will sometimes have headaches stemming from upper cervical region, spreading forward, ultimately settled at right parietal region, with light noise sensitivity, nauseous, sometimes sharp stabbing pain lasting for few seconds,  She denies gait abnormality, does report increased bilateral tinnitus, no visual loss, no limb weakness,  Lab in July 2021: CMP, creat 1.03,   MRI of cervical report in May 2019 from outside hospital, cervical spondylosis with mild to moderate canal stenosis from C4-5 through C6-7, multilevel neural foraminal stenosis,  MRI of lumbar spine, status post L4 laminectomy, partial laminectomy at L3 and 5, will see him since coursing the posterior spinal soft tissues, lumbar spondylosis worst at L4-5 with superimposed right subarticular  protrusion and facet arthrosis, producing moderate spinal stenosis, moderate bilateral foraminal narrowing  REVIEW OF SYSTEMS: Full 14 system review of systems performed and notable only for as above All other review of systems were negative.  ALLERGIES: Allergies  Allergen Reactions  . Lisinopril Anaphylaxis, Rash and Swelling  . Oxycodone-Acetaminophen Nausea And Vomiting    STOMACH ON FIRE , HURTING  . Sulfa Antibiotics Hives  . Sulfamethoxazole Rash    HOME MEDICATIONS: Current Outpatient Medications  Medication Sig Dispense Refill  . buPROPion (WELLBUTRIN XL) 150 MG 24 hr tablet Take 1 tablet (150 mg total) by mouth daily. 30 tablet 2  . buPROPion (WELLBUTRIN XL) 300 MG 24 hr tablet Take 1 tablet (300 mg total) by mouth daily. 30 tablet 2  . DULoxetine (CYMBALTA) 60 MG capsule Take 1 capsule (60 mg total) by mouth daily. 30 capsule 2  . gabapentin (NEURONTIN) 800 MG tablet Take 800 mg by mouth in the morning, at noon, in the evening, and at bedtime. Reports taking 1-4 times daily, depending on symptoms.    August 2021 losartan (COZAAR) 50 MG tablet Take 50 mg by mouth every evening.  0  . Multiple Vitamin (MULTIVITAMIN WITH MINERALS) TABS tablet Take 1 tablet by mouth daily.    . Oxycodone HCl 10 MG TABS Take 10 mg by mouth every 8 (eight) hours.  0  . propranolol (INDERAL) 80 MG tablet Take 80 mg by mouth 2 (two) times daily.  2  . tiZANidine (ZANAFLEX) 4 MG tablet Take 4 mg by mouth daily.  2  . zolpidem (AMBIEN CR) 12.5 MG CR tablet Take 1 tablet (12.5 mg total) by mouth at bedtime as needed for sleep. 30 tablet 2  . amLODipine (NORVASC) 2.5 MG  tablet Take 2.5 mg by mouth daily.    . chlorthalidone (HYGROTON) 25 MG tablet Take 25 mg by mouth every morning.    . rosuvastatin (CRESTOR) 5 MG tablet Take 5 mg by mouth 3 (three) times a week.    . telmisartan (MICARDIS) 40 MG tablet Take 40 mg by mouth daily.     No current facility-administered medications for this visit.    PAST  MEDICAL HISTORY: Past Medical History:  Diagnosis Date  . Chiari malformation type I (HCC)   . Coronary artery disease   . Head pain   . Hypertension   . Tinnitus     PAST SURGICAL HISTORY: Past Surgical History:  Procedure Laterality Date  . ABDOMINAL HYSTERECTOMY    . BACK SURGERY    . CARPAL TUNNEL RELEASE Bilateral   . chiari malformation    . thumb surgery Left   . TONSILLECTOMY      FAMILY HISTORY: Family History  Problem Relation Age of Onset  . Hypertension Mother   . Hyperlipidemia Mother   . Heart disease Father     SOCIAL HISTORY: Social History   Socioeconomic History  . Marital status: Legally Separated    Spouse name: Not on file  . Number of children: 4  . Years of education: college  . Highest education level: Bachelor's degree (e.g., BA, AB, BS)  Occupational History  . Occupation: Caregiver  Tobacco Use  . Smoking status: Current Every Day Smoker    Packs/day: 0.25    Types: Cigarettes  . Smokeless tobacco: Never Used  Vaping Use  . Vaping Use: Every day  Substance and Sexual Activity  . Alcohol use: Yes    Comment: occaional  . Drug use: No  . Sexual activity: Yes  Other Topics Concern  . Not on file  Social History Narrative   6 cups caffeine per day (Coke Zero).   Lives with significant other and his brother.   Right-handed.    Social Determinants of Health   Financial Resource Strain: Not on file  Food Insecurity: Not on file  Transportation Needs: Not on file  Physical Activity: Not on file  Stress: Not on file  Social Connections: Not on file  Intimate Partner Violence: Not on file     PHYSICAL EXAM   Vitals:   07/01/20 1420  BP: (!) 136/93  Pulse: 60  Weight: 175 lb (79.4 kg)  Height: 5' (1.524 m)   Not recorded     Body mass index is 34.18 kg/m.  PHYSICAL EXAMNIATION:  Gen: NAD, conversant, well nourised, well groomed                     Cardiovascular: Regular rate rhythm, no peripheral edema, warm,  nontender. Eyes: Conjunctivae clear without exudates or hemorrhage Neck: Supple, no carotid bruits. Pulmonary: Clear to auscultation bilaterally   NEUROLOGICAL EXAM:  MENTAL STATUS: Speech:    Speech is normal; fluent and spontaneous with normal comprehension.  Cognition:     Orientation to time, place and person     Normal recent and remote memory     Normal Attention span and concentration     Normal Language, naming, repeating,spontaneous speech     Fund of knowledge   CRANIAL NERVES: CN II: Visual fields are full to confrontation. Pupils are round equal and briskly reactive to light. CN III, IV, VI: extraocular movement are normal. No ptosis. CN V: Facial sensation is intact to light touch CN VII: Face is  symmetric with normal eye closure  CN VIII: Hearing is normal to causal conversation. CN IX, X: Phonation is normal. CN XI: Head turning and shoulder shrug are intact  MOTOR: There is no pronator drift of out-stretched arms. Muscle bulk and tone are normal. Muscle strength is normal.  REFLEXES: Reflexes are 2+ and symmetric at the biceps, triceps, knees, and ankles. Plantar responses are flexor.  SENSORY: Intact to light touch, pinprick and vibratory sensation are intact in fingers and toes.  COORDINATION: There is no trunk or limb dysmetria noted.  GAIT/STANCE: Posture is normal. Gait is steady with normal steps, base, arm swing, and turning. Heel and toe walking are normal. Tandem gait is normal.  Romberg is absent.   DIAGNOSTIC DATA (LABS, IMAGING, TESTING) - I reviewed patient records, labs, notes, testing and imaging myself where available.   ASSESSMENT AND PLAN  Autumn Greene is a 54 y.o. female   Previous history of Arnold-Chiari malformation, status post posterior craniotomy for decompression in 2011, Recurrent headache with migraine features, Neck pain  Normal neurological examination,  MRI of the brain to rule out structural  abnormality  Imitrex as needed, admixed with Zofran, Aleve   Levert Feinstein, M.D. Ph.D.  [facility] [address] [place], [place] [address] Ph: [phone] Fax: [phone]  CC:  Delma Officer, [place] [address] [place],  [place] [address]

## 2020-07-02 ENCOUNTER — Telehealth: Payer: Self-pay | Admitting: Neurology

## 2020-07-02 DIAGNOSIS — M542 Cervicalgia: Secondary | ICD-10-CM

## 2020-07-02 DIAGNOSIS — G8929 Other chronic pain: Secondary | ICD-10-CM

## 2020-07-02 DIAGNOSIS — R519 Headache, unspecified: Secondary | ICD-10-CM

## 2020-07-02 NOTE — Telephone Encounter (Signed)
[facility] pending  °

## 2020-07-03 ENCOUNTER — Ambulatory Visit: Payer: 59 | Admitting: Neurology

## 2020-07-03 NOTE — Telephone Encounter (Signed)
LVM for pt to call back about scheduling mri   [facility] auth: [id number] (exp. 07/02/20 to 09/30/20)

## 2020-07-22 ENCOUNTER — Ambulatory Visit: Payer: 59 | Admitting: Neurology

## 2020-07-22 ENCOUNTER — Other Ambulatory Visit: Payer: Self-pay | Admitting: Neurology

## 2020-07-22 MED ORDER — ALPRAZOLAM 1 MG PO TABS: ORAL_TABLET | ORAL | 0 refills | Status: DC

## 2020-07-22 NOTE — Telephone Encounter (Signed)
I sent in a prescription for alprazolam, 3 pills

## 2020-07-22 NOTE — Telephone Encounter (Signed)
x2 lvm for pt to call back.  

## 2020-07-22 NOTE — Telephone Encounter (Signed)
Patient returned my call she is scheduled at [facility] for 07/29/20. She informed me she is claustrophic and would like something to help her. She is aware to have a driver.

## 2020-07-22 NOTE — Telephone Encounter (Signed)
I called the patient and reviewed the details of the prescription with her. She verbalized understanding that she must have a driver to and from the scan.

## 2020-07-25 ENCOUNTER — Ambulatory Visit: Payer: 59

## 2020-07-28 NOTE — Telephone Encounter (Signed)
[facility] auth through [employer]: [id number] (exp. 07/28/20 to 08/26/20)

## 2020-07-28 NOTE — Telephone Encounter (Signed)
I just found out that the process for prior authorization for [facility] has changed. I did the new PA for the MRI through [employer]. They informed me that [facility] is out of network with [facility] for MRI's but [facility] is in network with [facility] for the MRI.   I tried to get a hold of the patient to informed her of this but I had to leave a voicemail informing her since we are out of network it will cost more and they would not approve it and that [facility] is in network. I canceled her appointment and will send the order to [facility] and they will reach out to the patient to schedule.

## 2020-07-28 NOTE — Telephone Encounter (Signed)
When you get a chance can you put a new MRI order in for [facility] to have a new accession number to schedule off of.

## 2020-07-29 ENCOUNTER — Other Ambulatory Visit: Payer: 59

## 2020-07-29 NOTE — Telephone Encounter (Signed)
Spoke with Dr Terrace Arabia and she gave v.o. for new MRI order which I have placed.

## 2020-07-29 NOTE — Telephone Encounter (Signed)
Noted, thank you

## 2020-07-29 NOTE — Addendum Note (Signed)
Addended by: Bertram Savin on: 07/29/2020 09:09 AM   Modules accepted: Orders

## 2020-07-29 NOTE — Telephone Encounter (Signed)
I tried to get a hold of the patient again but she didn't pick up I left her a voicemail informing her [facility] is out of network for the MRI's and that I sent the order to [facility] since they are who is in net work and they will reach out to her to schedule. And that I also canceled her appointment for today.

## 2020-07-30 NOTE — Telephone Encounter (Signed)
Scheduled at [facility] for 08/01/20.

## 2020-08-01 ENCOUNTER — Ambulatory Visit
Admission: RE | Admit: 2020-08-01 | Discharge: 2020-08-01 | Disposition: A | Discharge status: Home / Self Care | Payer: 59 | Source: Ambulatory Visit | Attending: Neurology | Admitting: Neurology

## 2020-08-01 ENCOUNTER — Other Ambulatory Visit: Payer: Self-pay

## 2020-08-01 DIAGNOSIS — M542 Cervicalgia: Secondary | ICD-10-CM

## 2020-08-01 DIAGNOSIS — G8929 Other chronic pain: Secondary | ICD-10-CM | POA: Diagnosis not present

## 2020-08-01 DIAGNOSIS — R519 Headache, unspecified: Secondary | ICD-10-CM

## 2020-08-07 ENCOUNTER — Ambulatory Visit: Payer: 59 | Admitting: Adult Health

## 2020-08-07 ENCOUNTER — Other Ambulatory Visit: Payer: Self-pay | Admitting: Physician Assistant

## 2020-08-08 ENCOUNTER — Encounter: Payer: Self-pay | Admitting: Adult Health

## 2020-08-08 ENCOUNTER — Telehealth (INDEPENDENT_AMBULATORY_CARE_PROVIDER_SITE_OTHER): Payer: 59 | Admitting: Adult Health

## 2020-08-08 DIAGNOSIS — F411 Generalized anxiety disorder: Secondary | ICD-10-CM | POA: Diagnosis not present

## 2020-08-08 DIAGNOSIS — F4010 Social phobia, unspecified: Secondary | ICD-10-CM

## 2020-08-08 DIAGNOSIS — G4700 Insomnia, unspecified: Secondary | ICD-10-CM | POA: Diagnosis not present

## 2020-08-08 DIAGNOSIS — F428 Other obsessive-compulsive disorder: Secondary | ICD-10-CM | POA: Diagnosis not present

## 2020-08-08 DIAGNOSIS — F331 Major depressive disorder, recurrent, moderate: Secondary | ICD-10-CM

## 2020-08-08 NOTE — Progress Notes (Signed)
Autumn Greene [id number] [birth year] 55 y.o.  Virtual Visit via Video Note  I connected with pt @ on 08/08/20 at  4:00 PM EST by a video enabled telemedicine application and verified that I am speaking with the correct person using two identifiers.   I discussed the limitations of evaluation and management by telemedicine and the availability of in person appointments. The patient expressed understanding and agreed to proceed.  I discussed the assessment and treatment plan with the patient. The patient was provided an opportunity to ask questions and all were answered. The patient agreed with the plan and demonstrated an understanding of the instructions.   The patient was advised to call back or seek an in-person evaluation if the symptoms worsen or if the condition fails to improve as anticipated.  I provided 30 minutes of non-face-to-face time during this encounter.  The patient was located at home.  The provider was located at [facility].   Dorothyann Gibbs, NP   Subjective:   Patient ID:  Autumn Greene is a 55 y.o. (DOB September 17, 1965) female.  Chief Complaint: No chief complaint on file.   HPI Ivan Maskell Moseman presents for follow-up of MDD, GAD, SAD, insomnia, and Obsessional thoughts.   Describes mood today as "ok". Pleasant. Mood symptoms - reports decreased depression, anxiety, and irritability. Stating "I'm doing alright". Mostly staying home. Not wanting to see anyone but her family. Has not been able to go to church - watching on TV. She did go out with granddaughter the past few days - shopping. Legs and feet swelling. Upcoming testing - has a nodule on her thyroid- feels like she gets choked on food and water. Has gained a lot of weight. Has an ultrasound scheduled for next week. Also plans to see a nutritionist. Seeing therapist. Improved interest and motivation. Taking medications as prescribed.  Energy levels "ok". Active, does not have a regular  exercise routine. Walking dog.  Enjoys some usual interests and activities. Lives with fiance. Spending time with family. Has 4 sons - 43, 30, 81, and 65. Appetite adequate. Weight stable - 170 pounds.  Sleeps better some nights than others. Averages  9 hours.   Focus and concentration difficulties. Completing tasks. Managing some aspects of household. 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 Denies SI or HI.  Denies AH or VH.   Review of Systems:  Review of Systems  Musculoskeletal: Negative for gait problem.  Neurological: Negative for tremors.  Psychiatric/Behavioral:       Please refer to HPI    Medications: I have reviewed the patient's current medications.  Current Outpatient Medications  Medication Sig Dispense Refill  . ALPRAZolam (XANAX) 1 MG tablet Take 1-2 tablets 30 minutes prior to MRI, may repeat once as needed. Must have driver. 3 tablet 0  . amLODipine (NORVASC) 2.5 MG tablet Take 2.5 mg by mouth daily.    Marland Kitchen buPROPion (WELLBUTRIN XL) 150 MG 24 hr tablet Take 1 tablet (150 mg total) by mouth daily. 30 tablet 2  . buPROPion (WELLBUTRIN XL) 300 MG 24 hr tablet Take 1 tablet (300 mg total) by mouth daily. 30 tablet 2  . chlorthalidone (HYGROTON) 25 MG tablet Take 25 mg by mouth every morning.    . DULoxetine (CYMBALTA) 60 MG capsule Take 1 capsule (60 mg total) by mouth daily. 30 capsule 2  . gabapentin (NEURONTIN) 800 MG tablet Take 800 mg by mouth in the morning, at noon, in the evening, and  at bedtime. Reports taking 1-4 times daily, depending on symptoms.    Marland Kitchen losartan (COZAAR) 50 MG tablet Take 50 mg by mouth every evening.  0  . Multiple Vitamin (MULTIVITAMIN WITH MINERALS) TABS tablet Take 1 tablet by mouth daily.    . ondansetron (ZOFRAN ODT) 4 MG disintegrating tablet Take 1 tablet (4 mg total) by mouth every 8 (eight) hours as needed. 20 tablet 6  . Oxycodone HCl 10 MG TABS Take 10 mg by mouth every 8 (eight) hours.  0  . propranolol  (INDERAL) 80 MG tablet Take 80 mg by mouth 2 (two) times daily.  2  . rosuvastatin (CRESTOR) 5 MG tablet Take 5 mg by mouth 3 (three) times a week.    . SUMAtriptan (IMITREX) 50 MG tablet Take 1 tablet (50 mg total) by mouth every 2 (two) hours as needed. May repeat in 2 hours if headache persists or recurs. 12 tablet 6  . telmisartan (MICARDIS) 40 MG tablet Take 40 mg by mouth daily.    Marland Kitchen tiZANidine (ZANAFLEX) 4 MG tablet Take 4 mg by mouth daily.  2  . zolpidem (AMBIEN CR) 12.5 MG CR tablet Take 1 tablet (12.5 mg total) by mouth at bedtime as needed for sleep. 30 tablet 2   No current facility-administered medications for this visit.    Medication Side Effects: None  Allergies:  Allergies  Allergen Reactions  . Lisinopril Anaphylaxis, Rash and Swelling    Other reaction(s): lip swelling  . Oxycodone-Acetaminophen Nausea And Vomiting    STOMACH ON FIRE , HURTING  . Sulfa Antibiotics Hives  . Atorvastatin     Other reaction(s): myalgia  . Chantix [Varenicline]     Other reaction(s): depression, headache  . Rosanil Cleanser [Sulfacetamide Sodium-Sulfur]     Other reaction(s): hives  . Sulfamethoxazole Rash    Past Medical History:  Diagnosis Date  . Chiari malformation type I (HCC)   . Coronary artery disease   . Head pain   . Hypertension   . Tinnitus     Family History  Problem Relation Age of Onset  . Hypertension Mother   . Hyperlipidemia Mother   . Heart disease Father     Social History   Socioeconomic History  . Marital status: Legally Separated    Spouse name: Not on file  . Number of children: 4  . Years of education: college  . Highest education level: Bachelor's degree (e.g., BA, AB, BS)  Occupational History  . Occupation: Caregiver  Tobacco Use  . Smoking status: Current Every Day Smoker    Packs/day: 0.25    Types: Cigarettes  . Smokeless tobacco: Never Used  Vaping Use  . Vaping Use: Every day  Substance and Sexual Activity  . Alcohol use:  Yes    Comment: occaional  . Drug use: No  . Sexual activity: Yes  Other Topics Concern  . Not on file  Social History Narrative   6 cups caffeine per day (Coke Zero).   Lives with significant other and his brother.   Right-handed.    Social Determinants of Health   Financial Resource Strain: Not on file  Food Insecurity: Not on file  Transportation Needs: Not on file  Physical Activity: Not on file  Stress: Not on file  Social Connections: Not on file  Intimate Partner Violence: Not on file    Past Medical History, Surgical history, Social history, and Family history were reviewed and updated as appropriate.   Please see review of  systems for further details on the patient's review from today.   Objective:   Physical Exam:  There were no vitals taken for this visit.  Physical Exam Constitutional:      General: She is not in acute distress. Musculoskeletal:        General: No deformity.  Neurological:     Mental Status: She is alert and oriented to person, place, and time.     Coordination: Coordination normal.  Psychiatric:        Attention and Perception: Attention and perception normal. She does not perceive auditory or visual hallucinations.        Mood and Affect: Mood normal. Mood is not anxious or depressed. Affect is not labile, blunt, angry or inappropriate.        Speech: Speech normal.        Behavior: Behavior normal.        Thought Content: Thought content normal. Thought content is not paranoid or delusional. Thought content does not include homicidal or suicidal ideation. Thought content does not include homicidal or suicidal plan.        Cognition and Memory: Cognition and memory normal.        Judgment: Judgment normal.     Comments: Insight intact     Lab Review:     Component Value Date/Time   NA 138 07/01/2017 1710   K 4.1 07/01/2017 1710   CL 107 07/01/2017 1710   CO2 23 07/01/2017 1710   GLUCOSE 94 07/01/2017 1710   BUN 25 (H)  07/01/2017 1710   CREATININE 1.04 (H) 07/01/2017 1710   CALCIUM 9.0 07/01/2017 1710   GFRNONAA >60 07/01/2017 1710   GFRAA >60 07/01/2017 1710       Component Value Date/Time   WBC 6.2 07/01/2017 1710   RBC 4.10 07/01/2017 1710   HGB 13.3 07/01/2017 1710   HCT 40.5 07/01/2017 1710   PLT 212 07/01/2017 1710   MCV 98.8 07/01/2017 1710   MCH 32.4 07/01/2017 1710   MCHC 32.8 07/01/2017 1710   RDW 12.9 07/01/2017 1710    No results found for: POCLITH, LITHIUM   No results found for: PHENYTOIN, PHENOBARB, VALPROATE, CBMZ   .res Assessment: Plan:    Plan:  PDMP reviewed  1. Cymbalta 60mg  daily 2. Wellbutrin XL 450mg  daily - denies seizure history 3. Ambien CR 12.5mg  at hs  Set up with a therapist - [place].  Read and reviewed note with patient for accuracy.   RTC 4 weeks  Patient advised to contact office with any questions, adverse effects, or acute worsening in signs and symptoms.    Diagnoses and all orders for this visit:  Obsessional thoughts  Insomnia, unspecified type  Social anxiety disorder  Generalized anxiety disorder  Major depressive disorder, recurrent episode, moderate (HCC)     Please see After Visit Summary for patient specific instructions.  Future Appointments  Date Time Provider Department Center  08/22/2020  2:20 PM [facility]  10/01/2020  2:45 PM 10/20/2020, NP [facility] None    No orders of the defined types were placed in this encounter.     -------------------------------

## 2020-08-18 ENCOUNTER — Other Ambulatory Visit: Payer: Self-pay | Admitting: Physician Assistant

## 2020-08-18 DIAGNOSIS — R490 Dysphonia: Secondary | ICD-10-CM

## 2020-08-18 DIAGNOSIS — R1310 Dysphagia, unspecified: Secondary | ICD-10-CM

## 2020-08-18 DIAGNOSIS — F17200 Nicotine dependence, unspecified, uncomplicated: Secondary | ICD-10-CM

## 2020-08-20 ENCOUNTER — Ambulatory Visit (INDEPENDENT_AMBULATORY_CARE_PROVIDER_SITE_OTHER): Payer: 59 | Admitting: Family Medicine

## 2020-08-22 ENCOUNTER — Other Ambulatory Visit: Payer: Self-pay

## 2020-08-22 ENCOUNTER — Ambulatory Visit
Admission: RE | Admit: 2020-08-22 | Discharge: 2020-08-22 | Disposition: A | Discharge status: Home / Self Care | Payer: 59 | Source: Ambulatory Visit | Attending: Physician Assistant | Admitting: Physician Assistant

## 2020-08-22 DIAGNOSIS — Z1231 Encounter for screening mammogram for malignant neoplasm of breast: Secondary | ICD-10-CM

## 2020-08-27 ENCOUNTER — Other Ambulatory Visit: Payer: 59

## 2020-08-29 ENCOUNTER — Ambulatory Visit
Admission: RE | Admit: 2020-08-29 | Discharge: 2020-08-29 | Disposition: A | Discharge status: Home / Self Care | Payer: 59 | Source: Ambulatory Visit | Attending: Physician Assistant | Admitting: Physician Assistant

## 2020-08-29 ENCOUNTER — Other Ambulatory Visit: Payer: Self-pay | Admitting: Physician Assistant

## 2020-08-29 DIAGNOSIS — R1310 Dysphagia, unspecified: Secondary | ICD-10-CM

## 2020-08-29 DIAGNOSIS — R490 Dysphonia: Secondary | ICD-10-CM

## 2020-08-29 DIAGNOSIS — F17200 Nicotine dependence, unspecified, uncomplicated: Secondary | ICD-10-CM

## 2020-09-03 ENCOUNTER — Ambulatory Visit (INDEPENDENT_AMBULATORY_CARE_PROVIDER_SITE_OTHER): Payer: 59 | Admitting: Family Medicine

## 2020-09-03 ENCOUNTER — Other Ambulatory Visit: Payer: Self-pay | Admitting: Adult Health

## 2020-09-03 DIAGNOSIS — G4700 Insomnia, unspecified: Secondary | ICD-10-CM

## 2020-09-05 ENCOUNTER — Other Ambulatory Visit: Payer: Self-pay | Admitting: Adult Health

## 2020-09-05 DIAGNOSIS — G4700 Insomnia, unspecified: Secondary | ICD-10-CM

## 2020-09-08 ENCOUNTER — Other Ambulatory Visit: Payer: Self-pay | Admitting: Adult Health

## 2020-09-08 DIAGNOSIS — G4700 Insomnia, unspecified: Secondary | ICD-10-CM

## 2020-09-09 ENCOUNTER — Other Ambulatory Visit: Payer: Self-pay | Admitting: Adult Health

## 2020-09-09 DIAGNOSIS — G4700 Insomnia, unspecified: Secondary | ICD-10-CM

## 2020-09-11 ENCOUNTER — Other Ambulatory Visit: Payer: Self-pay | Admitting: Adult Health

## 2020-09-11 DIAGNOSIS — G4700 Insomnia, unspecified: Secondary | ICD-10-CM

## 2020-09-12 ENCOUNTER — Other Ambulatory Visit: Payer: Self-pay | Admitting: Adult Health

## 2020-09-12 ENCOUNTER — Telehealth: Payer: Self-pay | Admitting: Adult Health

## 2020-09-12 DIAGNOSIS — G4700 Insomnia, unspecified: Secondary | ICD-10-CM

## 2020-09-12 MED ORDER — ZOLPIDEM TARTRATE ER 12.5 MG PO TBCR: 12.5000 mg | EXTENDED_RELEASE_TABLET | Freq: Every evening | ORAL | 2 refills | Status: DC | PRN

## 2020-09-12 MED ORDER — ZOLPIDEM TARTRATE 10 MG PO TABS
10.0000 mg | ORAL_TABLET | Freq: Every evening | ORAL | 2 refills | Status: DC | PRN
Start: 2020-09-12 — End: 2020-12-03

## 2020-09-12 NOTE — Telephone Encounter (Signed)
Addendum to 09/12/20 note 9:23 am. Patient called again at 2:39 pm today and said that 10 mg of Zolpidem does better for her. Please call to [facility] in [place].

## 2020-09-12 NOTE — Telephone Encounter (Signed)
Pt called and said that she needs a refill on her ambien cr 12.5 mg to be sent to the [facility] on [address] Rosalita Levan

## 2020-09-12 NOTE — Telephone Encounter (Signed)
Script sent  

## 2020-09-30 NOTE — Progress Notes (Signed)
Chief Complaint  Patient presents with   Follow-up    New rm, alone, states she is doing well     HISTORICAL  Autumn Greene is a 55 year old female, seen in request by her primary care PA [place], Olivia for evaluation of frequent headaches, history of Arnold-Chiari malformation, initial evaluation was on July 01, 2020.  I reviewed and summarized the referring note.  Past medical history Hypertension Hyperlipidemia Bipolar disorder, wellbutrin xl 450mg , cymbalta 60mg  daily  She reported a history of Arnold-Chiari malformation, presented with frequent neck pain, balance issues, mental confusion, radiating pain to bilateral shoulders, diagnosis was confirmed by imaging study, she had a posterior craniotomy for decompression in 2011 by Dr. , she reported significant improvement  Overall 2021, she began to have frequent neck pain, felt neck popping while moving her neck, will sometimes have headaches stemming from upper cervical region, spreading forward, ultimately settled at right parietal region, with light noise sensitivity, nauseous, sometimes sharp stabbing pain lasting for few seconds,  She denies gait abnormality, does report increased bilateral tinnitus, no visual loss, no limb weakness,  Lab in July 2021: CMP, creat 1.03,   MRI of cervical report in May 2019 from outside hospital, cervical spondylosis with mild to moderate canal stenosis from C4-5 through C6-7, multilevel neural foraminal stenosis,  MRI of lumbar spine, status post L4 laminectomy, partial laminectomy at L3 and 5, will see him since coursing the posterior spinal soft tissues, lumbar spondylosis worst at L4-5 with superimposed right subarticular protrusion and facet arthrosis, producing moderate spinal stenosis, moderate bilateral foraminal narrowing  Update October 01, 2020 SS: Here today alone, headaches are improved, on average once a week, takes Imitrex with good benefit. May take 20 minutes to  resolve. Thinks headaches improved due to less stress at home. 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 Already taking Wellbutrin, Cymbalta, gabapentin, propanolol, tizanidine, Ambien. Headaches comes up cervical area, settles mostly on right parietal area, spreads to frontal, migraine features.   Feels her neck is popping, crunching, at times. No numbness or weakness down the arms or leg. PCP referred for PT, wouldn't start until evaluated for neck issues. Reviewed prior MRI cervical spine with Dr.Yan known presence of cervical spondylosis.  MRI of the brain in January 2022 showed no significant abnormalities.  REVIEW OF SYSTEMS: Full 14 system review of systems performed and notable only for as above  All other review of systems were negative.  ALLERGIES: Allergies  Allergen Reactions   Lisinopril Anaphylaxis, Rash and Swelling    Other reaction(s): lip swelling   Oxycodone-Acetaminophen Nausea And Vomiting    STOMACH ON FIRE , HURTING   Sulfa Antibiotics Hives   Atorvastatin     Other reaction(s): myalgia   Chantix [Varenicline]     Other reaction(s): depression, headache   Rosanil Cleanser [Sulfacetamide Sodium-Sulfur]     Other reaction(s): hives   Sulfamethoxazole Rash    HOME MEDICATIONS: Current Outpatient Medications  Medication Sig Dispense Refill   ALPRAZolam (XANAX) 1 MG tablet Take 1-2 tablets 30 minutes prior to MRI, may repeat once as needed. Must have driver. 3 tablet 0   amLODipine (NORVASC) 2.5 MG tablet Take 2.5 mg by mouth daily.     buPROPion (WELLBUTRIN XL) 150 MG 24 hr tablet Take 1 tablet (150 mg total) by mouth daily. 30 tablet 2   buPROPion (WELLBUTRIN XL) 300 MG 24 hr tablet Take 1 tablet (300 mg total) by mouth daily. 30 tablet 2   chlorthalidone (HYGROTON)  25 MG tablet Take 25 mg by mouth every morning.     DULoxetine (CYMBALTA) 60 MG capsule Take 1 capsule (60 mg total) by mouth daily. 30 capsule 2   gabapentin (NEURONTIN) 800 MG  tablet Take 800 mg by mouth in the morning, at noon, in the evening, and at bedtime. Reports taking 1-4 times daily, depending on symptoms.     losartan (COZAAR) 50 MG tablet Take 50 mg by mouth every evening.  0   Multiple Vitamin (MULTIVITAMIN WITH MINERALS) TABS tablet Take 1 tablet by mouth daily.     propranolol (INDERAL) 80 MG tablet Take 80 mg by mouth 2 (two) times daily.  2   rosuvastatin (CRESTOR) 5 MG tablet Take 5 mg by mouth 3 (three) times a week.     SUMAtriptan (IMITREX) 50 MG tablet Take 1 tablet (50 mg total) by mouth every 2 (two) hours as needed. May repeat in 2 hours if headache persists or recurs. 12 tablet 6   telmisartan (MICARDIS) 40 MG tablet Take 40 mg by mouth daily.     tiZANidine (ZANAFLEX) 4 MG tablet Take 4 mg by mouth daily.  2   zolpidem (AMBIEN CR) 12.5 MG CR tablet Take 1 tablet (12.5 mg total) by mouth at bedtime as needed for sleep. 30 tablet 2   zolpidem (AMBIEN) 10 MG tablet Take 1 tablet (10 mg total) by mouth at bedtime as needed for sleep. 30 tablet 2   No current facility-administered medications for this visit.    PAST MEDICAL HISTORY: Past Medical History:  Diagnosis Date   Chiari malformation type I (HCC)    Coronary artery disease    Head pain    Hypertension    Tinnitus     PAST SURGICAL HISTORY: Past Surgical History:  Procedure Laterality Date   ABDOMINAL HYSTERECTOMY     BACK SURGERY     CARPAL TUNNEL RELEASE Bilateral    chiari malformation     thumb surgery Left    TONSILLECTOMY      FAMILY HISTORY: Family History  Problem Relation Age of Onset   Hypertension Mother    Hyperlipidemia Mother    Heart disease Father     SOCIAL HISTORY: Social History   Socioeconomic History   Marital status: Legally Separated    Spouse name: Not on file   Number of children: 4   Years of education: college   Highest education level: Bachelor's degree (e.g., BA, AB, BS)  Occupational History    Occupation: Caregiver  Tobacco Use   Smoking status: Current Every Day Smoker    Packs/day: 0.25    Types: Cigarettes   Smokeless tobacco: Never Used  Vaping Use   Vaping Use: Every day  Substance and Sexual Activity   Alcohol use: Yes    Comment: occaional   Drug use: No   Sexual activity: Yes  Other Topics Concern   Not on file  Social History Narrative   6 cups caffeine per day (Coke Zero).   Lives with significant other and his brother.   Right-handed.    Social Determinants of Health   Financial Resource Strain: Not on file  Food Insecurity: Not on file  Transportation Needs: Not on file  Physical Activity: Not on file  Stress: Not on file  Social Connections: Not on file  Intimate Partner Violence: Not on file   PHYSICAL EXAM   Vitals:   10/01/20 1459  BP: 140/87  Pulse: (!) 59  Weight: 172 lb (78  kg)  Height: 4\' 11"  (1.499 m)   Not recorded     Body mass index is 34.74 kg/m.  PHYSICAL EXAMNIATION:  Gen: NAD, conversant, well nourised, well groomed                     Cardiovascular: Regular rate rhythm, no peripheral edema, warm, nontender. Pulmonary: Clear to auscultation bilaterally   NEUROLOGICAL EXAM:  MENTAL STATUS: Speech:    Speech is normal; fluent and spontaneous with normal comprehension.  Cognition:     Orientation to time, place and person     Normal recent and remote memory     Normal Attention span and concentration     Normal Language, naming, repeating,spontaneous speech     Fund of knowledge   CRANIAL NERVES: CN II: Visual fields are full to confrontation. Pupils are round equal and briskly reactive to light. CN III, IV, VI: extraocular movement are normal. No ptosis. CN V: Facial sensation is intact to light touch CN VII: Face is symmetric with normal eye closure  CN VIII: Hearing is normal to causal conversation. CN IX, X: Phonation is normal. CN XI: Head turning and shoulder shrug are intact, full ROM of cervical  spine  MOTOR: There is no pronator drift of out-stretched arms. Muscle bulk and tone are normal. Muscle strength is normal.  REFLEXES: Reflexes are 2+ and symmetric at the biceps, triceps, knees, and ankles. Plantar responses are flexor.  SENSORY: Intact to light touch, pinprick and vibratory sensation are intact in fingers and toes.  COORDINATION: There is no trunk or limb dysmetria noted.  GAIT/STANCE: Posture is normal. Gait is steady with normal steps, base, arm swing, and turning. Heel and toe walking are normal. Tandem gait is normal.  Romberg is absent.  DIAGNOSTIC DATA (LABS, IMAGING, TESTING) - I reviewed patient records, labs, notes, testing and imaging myself where available.   ASSESSMENT AND PLAN  Dala Breault is a 56 y.o. female   1. Previous history of Arnold-Chiari malformation, status post posterior craniotomy for decompression in 2011, 2. Recurrent headache with migraine features, 3. Neck pain -Headaches improved on average 1 a week, excellent benefit with Imitrex -Normal neurological examination, -MRI of the brain was unremarkable in January 2022 -Continue Imitrex as needed, can mix with Zofran, Aleve for migraine headache -Prior MRI cervical spine has already shown cervical spondylosis, there is no radicular symptoms, encouraged to start PT, try heat to the neck, no indication for additional imaging at this time, symptoms stable -Continue routine follow-up with PCP, follow-up at our office on an as-needed basis  I spent 30 minutes of face-to-face and non-face-to-face time with patient.  This included previsit chart review, lab review, study review, order entry, electronic health record documentation, patient education.  February 2022, DNP  [facility] [address], [address] [place], [place] [phone]

## 2020-10-01 ENCOUNTER — Ambulatory Visit (INDEPENDENT_AMBULATORY_CARE_PROVIDER_SITE_OTHER): Payer: 59 | Admitting: Neurology

## 2020-10-01 ENCOUNTER — Encounter: Payer: Self-pay | Admitting: Neurology

## 2020-10-01 VITALS — BP 140/87 | HR 59 | Ht 59.0 in | Wt 172.0 lb

## 2020-10-01 DIAGNOSIS — R519 Headache, unspecified: Secondary | ICD-10-CM

## 2020-10-01 DIAGNOSIS — G8929 Other chronic pain: Secondary | ICD-10-CM | POA: Diagnosis not present

## 2020-10-01 DIAGNOSIS — M542 Cervicalgia: Secondary | ICD-10-CM

## 2020-10-01 NOTE — Patient Instructions (Signed)
Continue Imitrex for acute headache Try PT for neck issues Continue to see your PCP See you back as needed

## 2020-10-20 ENCOUNTER — Encounter (INDEPENDENT_AMBULATORY_CARE_PROVIDER_SITE_OTHER): Payer: Self-pay | Admitting: Otolaryngology

## 2020-10-20 ENCOUNTER — Ambulatory Visit (INDEPENDENT_AMBULATORY_CARE_PROVIDER_SITE_OTHER): Payer: 59 | Admitting: Otolaryngology

## 2020-10-20 ENCOUNTER — Other Ambulatory Visit: Payer: Self-pay

## 2020-10-20 VITALS — Temp 97.7°F

## 2020-10-20 DIAGNOSIS — K225 Diverticulum of esophagus, acquired: Secondary | ICD-10-CM | POA: Diagnosis not present

## 2020-10-20 DIAGNOSIS — K219 Gastro-esophageal reflux disease without esophagitis: Secondary | ICD-10-CM

## 2020-10-20 NOTE — Progress Notes (Signed)
HPI: Autumn Greene is a 55 y.o. female who presents is referred by her PCP for evaluation of newly discovered Zenker's diverticulum and also complains of her hearing is getting worse.  She was not scheduled for audiologic testing when she scheduled her appointment. Apparently she was complaining of some swallowing problems like food was getting stuck in her throat and points to the level of the cricoid cartilage.  She also noted reflux type symptoms.  She underwent a barium swallow which demonstrated a small 1 cm Zenker's.diverticulum.  This was nonobstructing. She was placed on pantoprazole 40 mg twice daily but did not tolerate this and stopped taking the pantoprazole. She is referred here for discussion of Zenker's diverticulum. I reviewed the barium swallow with the patient in the office today..  Past Medical History:  Diagnosis Date  . Chiari malformation type I (HCC)   . Coronary artery disease   . Head pain   . Hypertension   . Tinnitus    Past Surgical History:  Procedure Laterality Date  . ABDOMINAL HYSTERECTOMY    . BACK SURGERY    . CARPAL TUNNEL RELEASE Bilateral   . chiari malformation    . thumb surgery Left   . TONSILLECTOMY     Social History   Socioeconomic History  . Marital status: Legally Separated    Spouse name: Not on file  . Number of children: 4  . Years of education: college  . Highest education level: Bachelor's degree (e.g., BA, AB, BS)  Occupational History  . Occupation: Caregiver  Tobacco Use  . Smoking status: Current Every Day Smoker    Packs/day: 0.25    Types: Cigarettes  . Smokeless tobacco: Never Used  Vaping Use  . Vaping Use: Every day  Substance and Sexual Activity  . Alcohol use: Yes    Comment: occaional  . Drug use: No  . Sexual activity: Yes  Other Topics Concern  . Not on file  Social History Narrative   6 cups caffeine per day (Coke Zero).   Lives with significant other and his brother.   Right-handed.    Social  Determinants of Health   Financial Resource Strain: Not on file  Food Insecurity: Not on file  Transportation Needs: Not on file  Physical Activity: Not on file  Stress: Not on file  Social Connections: Not on file   Family History  Problem Relation Age of Onset  . Hypertension Mother   . Hyperlipidemia Mother   . Heart disease Father    Allergies  Allergen Reactions  . Lisinopril Anaphylaxis, Rash and Swelling    Other reaction(s): lip swelling  . Oxycodone-Acetaminophen Nausea And Vomiting    STOMACH ON FIRE , HURTING  . Sulfa Antibiotics Hives  . Atorvastatin     Other reaction(s): myalgia  . Chantix [Varenicline]     Other reaction(s): depression, headache  . Rosanil Cleanser [Sulfacetamide Sodium-Sulfur]     Other reaction(s): hives  . Sulfamethoxazole Rash   Prior to Admission medications   Medication Sig Start Date End Date Taking? Authorizing Provider  ALPRAZolam Prudy Feeler) 1 MG tablet Take 1-2 tablets 30 minutes prior to MRI, may repeat once as needed. Must have driver. 07/22/20   Sater, Pearletha Furl, MD  amLODipine (NORVASC) 2.5 MG tablet Take 2.5 mg by mouth daily. 05/04/20   [provider]  buPROPion (WELLBUTRIN XL) 150 MG 24 hr tablet Take 1 tablet (150 mg total) by mouth daily. 06/24/20   Mozingo, Thereasa Solo, NP  buPROPion (  WELLBUTRIN XL) 300 MG 24 hr tablet Take 1 tablet (300 mg total) by mouth daily. 06/24/20   Mozingo, Thereasa Solo, NP  chlorthalidone (HYGROTON) 25 MG tablet Take 25 mg by mouth every morning. 04/11/20   [provider]  DULoxetine (CYMBALTA) 60 MG capsule Take 1 capsule (60 mg total) by mouth daily. 06/24/20   Mozingo, Thereasa Solo, NP  gabapentin (NEURONTIN) 800 MG tablet Take 800 mg by mouth in the morning, at noon, in the evening, and at bedtime. Reports taking 1-4 times daily, depending on symptoms. 04/06/19   [provider]  losartan (COZAAR) 50 MG tablet Take 50 mg by mouth every evening. 05/31/17   [provider]  Multiple Vitamin (MULTIVITAMIN WITH MINERALS) TABS tablet Take 1 tablet by mouth daily.    [provider]  propranolol (INDERAL) 80 MG tablet Take 80 mg by mouth 2 (two) times daily. 04/26/17   [provider]  rosuvastatin (CRESTOR) 5 MG tablet Take 5 mg by mouth 3 (three) times a week. 04/23/20   [provider]  SUMAtriptan (IMITREX) 50 MG tablet Take 1 tablet (50 mg total) by mouth every 2 (two) hours as needed. May repeat in 2 hours if headache persists or recurs. 07/01/20   Levert Feinstein, MD  telmisartan (MICARDIS) 40 MG tablet Take 40 mg by mouth daily.    [provider]  tiZANidine (ZANAFLEX) 4 MG tablet Take 4 mg by mouth daily. 04/07/17   [provider]  zolpidem (AMBIEN CR) 12.5 MG CR tablet Take 1 tablet (12.5 mg total) by mouth at bedtime as needed for sleep. 09/12/20   Mozingo, Thereasa Solo, NP  zolpidem (AMBIEN) 10 MG tablet Take 1 tablet (10 mg total) by mouth at bedtime as needed for sleep. 09/12/20 10/12/20  Mozingo, Thereasa Solo, NP     Positive ROS: Otherwise negative  All other systems have been reviewed and were otherwise negative with the exception of those mentioned in the HPI and as above.  Physical Exam: Constitutional: Alert, well-appearing, no acute distress Ears: External ears without lesions or tenderness. Ear canals are clear bilaterally with intact, clear TMs bilaterally.  On hearing screening with a 1024 tuning fork she had minimal hearing loss in both ears. Nasal: External nose without lesions. Septum with minimal deformity.. Clear nasal passages bilaterally. Oral: Lips and gums without lesions. Tongue and palate mucosa without lesions. Posterior oropharynx clear.  Indirect laryngoscopy revealed a clear base of tongue epiglottis and piriform sinuses. Neck: No palpable adenopathy or masses.  No significant palpable thyroid nodules. Respiratory: Breathing comfortably  Skin: No facial/neck lesions or  rash noted.  Procedures  Assessment: Small 1 cm Zenker's diverticulum. Symptoms of laryngeal pharyngeal reflux Complaints of decreased hearing  Plan: I discussed with her concerning audiologist that can perform hearing evaluation that take her insurance.  Also discussed whether the only treatment options for hearing loss if discovered would be hearing aid use. Concerning the Zenker's diverticulum this is small and not causing any obstructive symptoms.  I generally would not recommend intervention on a Zenker's diverticulum unless it was 2 and half centimeters or larger. Concerning her globus type symptoms would recommend use of PPI and suggested maybe switching it to once a day before dinner in order to cover nighttime where people have more reflux while supine.  Could consider use of over-the-counter Prilosec versus Protonix which she did not feel like she tolerated well.   Narda Bonds, MD   CC:

## 2020-11-12 ENCOUNTER — Other Ambulatory Visit: Payer: Self-pay | Admitting: Adult Health

## 2020-11-12 DIAGNOSIS — F331 Major depressive disorder, recurrent, moderate: Secondary | ICD-10-CM

## 2020-11-14 NOTE — Telephone Encounter (Signed)
Please schedule appt

## 2020-11-18 ENCOUNTER — Ambulatory Visit (INDEPENDENT_AMBULATORY_CARE_PROVIDER_SITE_OTHER): Payer: 59 | Admitting: Pulmonary Disease

## 2020-11-18 ENCOUNTER — Other Ambulatory Visit: Payer: Self-pay

## 2020-11-18 ENCOUNTER — Ambulatory Visit (INDEPENDENT_AMBULATORY_CARE_PROVIDER_SITE_OTHER): Payer: 59

## 2020-11-18 ENCOUNTER — Encounter: Payer: Self-pay | Admitting: Pulmonary Disease

## 2020-11-18 VITALS — BP 122/74 | HR 67 | Temp 97.6°F | Ht 59.0 in | Wt 169.4 lb

## 2020-11-18 DIAGNOSIS — R911 Solitary pulmonary nodule: Secondary | ICD-10-CM

## 2020-11-18 DIAGNOSIS — F331 Major depressive disorder, recurrent, moderate: Secondary | ICD-10-CM

## 2020-11-18 DIAGNOSIS — R0600 Dyspnea, unspecified: Secondary | ICD-10-CM

## 2020-11-18 DIAGNOSIS — R0609 Other forms of dyspnea: Secondary | ICD-10-CM

## 2020-11-18 MED ORDER — BUPROPION HCL ER (XL) 300 MG PO TB24: 300.0000 mg | ORAL_TABLET | Freq: Every day | ORAL | 0 refills | Status: DC

## 2020-11-18 NOTE — Patient Instructions (Signed)
Nice to meet you  I have ordered PFTs or breathing test.  This will be important to help determine how best to approach the issue with the device as well as help evaluate why you are short of breath.  This can be scheduled at any time.  Please stop by the front desk on the way out to schedule these.  Based on the results, we may need to start some medications.  We can wait on the results so we know how best to help.  Return to clinic for follow-up in 3 months or sooner as needed

## 2020-11-18 NOTE — Telephone Encounter (Signed)
Pt has an appt on 5/18

## 2020-11-19 ENCOUNTER — Other Ambulatory Visit: Payer: Self-pay | Admitting: Adult Health

## 2020-11-19 DIAGNOSIS — F331 Major depressive disorder, recurrent, moderate: Secondary | ICD-10-CM

## 2020-11-21 ENCOUNTER — Other Ambulatory Visit (HOSPITAL_COMMUNITY): Payer: 59

## 2020-11-24 ENCOUNTER — Other Ambulatory Visit (HOSPITAL_COMMUNITY): Payer: 59

## 2020-12-01 ENCOUNTER — Other Ambulatory Visit (HOSPITAL_COMMUNITY)
Admission: RE | Admit: 2020-12-01 | Discharge: 2020-12-01 | Disposition: A | Discharge status: Home / Self Care | Payer: 59 | Source: Ambulatory Visit | Attending: Pulmonary Disease | Admitting: Pulmonary Disease

## 2020-12-01 ENCOUNTER — Other Ambulatory Visit: Payer: Self-pay | Admitting: Neurology

## 2020-12-01 ENCOUNTER — Other Ambulatory Visit: Payer: Self-pay | Admitting: Adult Health

## 2020-12-01 DIAGNOSIS — Z01812 Encounter for preprocedural laboratory examination: Secondary | ICD-10-CM | POA: Insufficient documentation

## 2020-12-01 DIAGNOSIS — Z20822 Contact with and (suspected) exposure to covid-19: Secondary | ICD-10-CM | POA: Insufficient documentation

## 2020-12-01 DIAGNOSIS — G4700 Insomnia, unspecified: Secondary | ICD-10-CM

## 2020-12-01 LAB — SARS CORONAVIRUS 2 (TAT 6-24 HRS): SARS Coronavirus 2: NEGATIVE

## 2020-12-03 ENCOUNTER — Encounter: Payer: Self-pay | Admitting: Adult Health

## 2020-12-03 ENCOUNTER — Other Ambulatory Visit: Payer: Self-pay

## 2020-12-03 ENCOUNTER — Ambulatory Visit (INDEPENDENT_AMBULATORY_CARE_PROVIDER_SITE_OTHER): Payer: 59 | Admitting: Adult Health

## 2020-12-03 ENCOUNTER — Ambulatory Visit: Payer: 59

## 2020-12-03 DIAGNOSIS — R0609 Other forms of dyspnea: Secondary | ICD-10-CM

## 2020-12-03 DIAGNOSIS — F4010 Social phobia, unspecified: Secondary | ICD-10-CM | POA: Diagnosis not present

## 2020-12-03 DIAGNOSIS — F331 Major depressive disorder, recurrent, moderate: Secondary | ICD-10-CM | POA: Diagnosis not present

## 2020-12-03 DIAGNOSIS — G4700 Insomnia, unspecified: Secondary | ICD-10-CM

## 2020-12-03 DIAGNOSIS — F411 Generalized anxiety disorder: Secondary | ICD-10-CM | POA: Diagnosis not present

## 2020-12-03 DIAGNOSIS — R0600 Dyspnea, unspecified: Secondary | ICD-10-CM

## 2020-12-03 LAB — PULMONARY FUNCTION TEST
DL/VA % pred: 104 %
DL/VA: 4.61 ml/min/mmHg/L
DLCO cor % pred: 100 %
DLCO cor: 18.29 ml/min/mmHg
DLCO unc % pred: 100 %
DLCO unc: 18.29 ml/min/mmHg
FEF 25-75 Post: 2.47 L/sec
FEF 25-75 Pre: 2.54 L/sec
FEF2575-%Change-Post: -2 %
FEF2575-%Pred-Post: 100 %
FEF2575-%Pred-Pre: 103 %
FEV1-%Change-Post: 0 %
FEV1-%Pred-Post: 94 %
FEV1-%Pred-Pre: 95 %
FEV1-Post: 2.27 L
FEV1-Pre: 2.28 L
FEV1FVC-%Change-Post: 3 %
FEV1FVC-%Pred-Pre: 104 %
FEV6-%Change-Post: -3 %
FEV6-%Pred-Post: 89 %
FEV6-%Pred-Pre: 92 %
FEV6-Post: 2.63 L
FEV6-Pre: 2.73 L
FEV6FVC-%Pred-Post: 102 %
FEV6FVC-%Pred-Pre: 102 %
FVC-%Change-Post: -3 %
FVC-%Pred-Post: 87 %
FVC-%Pred-Pre: 90 %
FVC-Post: 2.63 L
FVC-Pre: 2.73 L
Post FEV1/FVC ratio: 86 %
Post FEV6/FVC ratio: 100 %
Pre FEV1/FVC ratio: 84 %
Pre FEV6/FVC Ratio: 100 %
RV % pred: 83 %
RV: 1.36 L
TLC % pred: 93 %
TLC: 4.16 L

## 2020-12-03 MED ORDER — DULOXETINE HCL 60 MG PO CPEP
60.0000 mg | ORAL_CAPSULE | Freq: Every day | ORAL | 5 refills | Status: DC
Start: 2020-12-03 — End: 2021-03-18

## 2020-12-03 MED ORDER — BUPROPION HCL ER (XL) 300 MG PO TB24
300.0000 mg | ORAL_TABLET | Freq: Every day | ORAL | 5 refills | Status: DC
Start: 2020-12-03 — End: 2021-06-03

## 2020-12-03 MED ORDER — BUPROPION HCL ER (XL) 150 MG PO TB24: 1.0000 | ORAL_TABLET | Freq: Every day | ORAL | 5 refills | Status: DC

## 2020-12-03 MED ORDER — ZOLPIDEM TARTRATE 10 MG PO TABS: ORAL_TABLET | ORAL | 2 refills | Status: DC

## 2020-12-03 NOTE — Progress Notes (Signed)
Autumn Greene [id number] March 26, 1966 55 y.o.  Subjective:   Patient ID:  Autumn Greene is a 55 y.o. (DOB 1965/09/25) female.  Chief Complaint: No chief complaint on file.   HPI Autumn Greene presents to the office today for follow-up of MDD, GAD, SAD, insomnia, and Obsessional thoughts.   Describes mood today as "ok". Pleasant. Mood symptoms - reports decreased depression, anxiety, and irritability - "here and there". Stating "I'm doing ok". Mostly staying home. Watching TV. Visiting with family. Working with a Health and safety inspector. Is more conscious about what she eats. Legs and feet not swelling as bad. Had a fall recently - fell on face and hip. Saw endocrinologist for thyroid nodule - watching it for now. Has not seen therapist recently. Seeing therapist. Improved interest and motivation. Taking medications as prescribed.  Energy levels "ok". Active, does not have a regular exercise routine. Walking dog.  Enjoys some usual interests and activities. Lives with fiance. Spending time with family. Has 4 sons - 39, 30, 23, and 53. Painting. Appetite adequate. Weight loss - 165 from 170 pounds.  Sleeps better some nights than others - "more of a problem lately". Sleeps 6 hours. Focus and concentration difficulties. Completing tasks. Managing some aspects of household. 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 Denies SI or HI.  Denies AH or VH.   Review of Systems:  Review of Systems  Musculoskeletal: Negative for gait problem.  Neurological: Negative for tremors.  Psychiatric/Behavioral:       Please refer to HPI    Medications: I have reviewed the patient's current medications.  Current Outpatient Medications  Medication Sig Dispense Refill  . ALPRAZolam (XANAX) 1 MG tablet Take 1-2 tablets 30 minutes prior to MRI, may repeat once as needed. Must have driver. 3 tablet 0  . amLODipine (NORVASC) 2.5 MG tablet Take 2.5 mg by mouth daily.    Marland Kitchen buPROPion  (WELLBUTRIN XL) 150 MG 24 hr tablet Take 1 tablet (150 mg total) by mouth daily. 30 tablet 5  . buPROPion (WELLBUTRIN XL) 300 MG 24 hr tablet Take 1 tablet (300 mg total) by mouth daily. 30 tablet 5  . chlorthalidone (HYGROTON) 25 MG tablet Take 25 mg by mouth every morning.    . DULoxetine (CYMBALTA) 60 MG capsule Take 1 capsule (60 mg total) by mouth daily. 30 capsule 5  . gabapentin (NEURONTIN) 800 MG tablet Take 800 mg by mouth in the morning, at noon, in the evening, and at bedtime. Reports taking 1-4 times daily, depending on symptoms.    Marland Kitchen losartan (COZAAR) 50 MG tablet Take 50 mg by mouth every evening.  0  . Multiple Vitamin (MULTIVITAMIN WITH MINERALS) TABS tablet Take 1 tablet by mouth daily.    . propranolol (INDERAL) 80 MG tablet Take 80 mg by mouth 2 (two) times daily.  2  . rosuvastatin (CRESTOR) 5 MG tablet Take 5 mg by mouth 3 (three) times a week.    . SUMAtriptan (IMITREX) 50 MG tablet Take 1 tablet (50 mg total) by mouth every 2 (two) hours as needed. May repeat in 2 hours if headache persists or recurs. 12 tablet 6  . telmisartan (MICARDIS) 40 MG tablet Take 40 mg by mouth daily.    Marland Kitchen tiZANidine (ZANAFLEX) 4 MG tablet Take 4 mg by mouth daily.  2  . zolpidem (AMBIEN) 10 MG tablet Take one and 1/4 tablets at bedtime as needed for sleep. 38 tablet 2   No current facility-administered medications  for this visit.    Medication Side Effects: None  Allergies:  Allergies  Allergen Reactions  . Lisinopril Anaphylaxis, Rash and Swelling    Other reaction(s): lip swelling  . Sulfa Antibiotics Hives  . Atorvastatin     Other reaction(s): myalgia  . Chantix [Varenicline]     Other reaction(s): depression, headache  . Rosanil Cleanser [Sulfacetamide Sodium-Sulfur]     Other reaction(s): hives  . Sulfamethoxazole Rash    Past Medical History:  Diagnosis Date  . Chiari malformation type I (HCC)   . Coronary artery disease   . Head pain   . Hypertension   . Tinnitus      Past Medical History, Surgical history, Social history, and Family history were reviewed and updated as appropriate.   Please see review of systems for further details on the patient's review from today.   Objective:   Physical Exam:  There were no vitals taken for this visit.  Physical Exam Constitutional:      General: She is not in acute distress. Musculoskeletal:        General: No deformity.  Neurological:     Mental Status: She is alert and oriented to person, place, and time.     Coordination: Coordination normal.  Psychiatric:        Attention and Perception: Attention and perception normal. She does not perceive auditory or visual hallucinations.        Mood and Affect: Mood normal. Mood is not anxious or depressed. Affect is not labile, blunt, angry or inappropriate.        Speech: Speech normal.        Behavior: Behavior normal.        Thought Content: Thought content normal. Thought content is not paranoid or delusional. Thought content does not include homicidal or suicidal ideation. Thought content does not include homicidal or suicidal plan.        Cognition and Memory: Cognition and memory normal.        Judgment: Judgment normal.     Comments: Insight intact     Lab Review:     Component Value Date/Time   NA 138 07/01/2017 1710   K 4.1 07/01/2017 1710   CL 107 07/01/2017 1710   CO2 23 07/01/2017 1710   GLUCOSE 94 07/01/2017 1710   BUN 25 (H) 07/01/2017 1710   CREATININE 1.04 (H) 07/01/2017 1710   CALCIUM 9.0 07/01/2017 1710   GFRNONAA >60 07/01/2017 1710   GFRAA >60 07/01/2017 1710       Component Value Date/Time   WBC 6.2 07/01/2017 1710   RBC 4.10 07/01/2017 1710   HGB 13.3 07/01/2017 1710   HCT 40.5 07/01/2017 1710   PLT 212 07/01/2017 1710   MCV 98.8 07/01/2017 1710   MCH 32.4 07/01/2017 1710   MCHC 32.8 07/01/2017 1710   RDW 12.9 07/01/2017 1710    No results found for: POCLITH, LITHIUM   No results found for: PHENYTOIN,  PHENOBARB, VALPROATE, CBMZ   .res Assessment: Plan:    Plan:  PDMP reviewed  1. Cymbalta 60mg  daily 2. Wellbutrin XL 450mg  daily - denies seizure history 3. Ambien CR 12.5mg  at hs  Set up with a therapist - [facility].  Read and reviewed note with patient for accuracy.   RTC 6 months  Patient advised to contact office with any questions, adverse effects, or acute worsening in signs and symptoms.   Diagnoses and all orders for this visit:  Social anxiety disorder  Insomnia, unspecified type -  zolpidem (AMBIEN) 10 MG tablet; Take one and 1/4 tablets at bedtime as needed for sleep.  Major depressive disorder, recurrent episode, moderate (HCC) -     DULoxetine (CYMBALTA) 60 MG capsule; Take 1 capsule (60 mg total) by mouth daily. -     buPROPion (WELLBUTRIN XL) 300 MG 24 hr tablet; Take 1 tablet (300 mg total) by mouth daily. -     buPROPion (WELLBUTRIN XL) 150 MG 24 hr tablet; Take 1 tablet (150 mg total) by mouth daily.  Generalized anxiety disorder -     DULoxetine (CYMBALTA) 60 MG capsule; Take 1 capsule (60 mg total) by mouth daily.     Please see After Visit Summary for patient specific instructions.  Future Appointments  Date Time Provider Department Center  12/03/2020  4:00 PM [facility] PFT ROOM [facility] None    No orders of the defined types were placed in this encounter.   -------------------------------

## 2020-12-08 ENCOUNTER — Other Ambulatory Visit: Payer: Self-pay | Admitting: Adult Health

## 2020-12-08 DIAGNOSIS — G4700 Insomnia, unspecified: Secondary | ICD-10-CM

## 2020-12-11 NOTE — Telephone Encounter (Signed)
Patient sent a MyChart message requesting the results of her PFT test on 12/03/20.   MH, can you please advise? Thanks!

## 2020-12-11 NOTE — Telephone Encounter (Signed)
PFTs are normal 

## 2020-12-15 NOTE — Progress Notes (Signed)
@Patient  ID: , female    DOB: [birth year], 55 y.o.   MRN: [id number]  Chief Complaint  Patient presents with  . Consult    Referred by PCP for SOB and evaluation of not being able to breathe into the breathalyzer device. Notices the SOB more so in the mornings. States when she attempts to take a deep breath, she will start coughing. Cough is described as non-productive.     Referring provider: [id number], PA  HPI:   55 year old whom we are seeing in consultation for evaluation of difficulty going into breathalyzer machine to a night car.  PCP note reviewed.  Patient has been able to successfully activate breathalyzer ignition in her car.  Says sometimes blows too fast, sometimes not someone hard enough.  Sounds like has been coached but has not had a lot of instruction.  This prompted referral.  On further questioning she does endorse some mild dyspnea on exertion and cough.  Present for some weeks to months.  No clear time of day when better or worse.  No relieving or exacerbating factors.  No seasonal environmental changes.  No position that makes things better or worse.  Cough is dry, nonproductive.  Denies seasonal allergies, runny nose, watery eyes.  No history of asthma.  No history of respiratory illnesses.  Does smoke and has smoked for some time.  Chest x-ray 06/2011 shows nodular opacity left upper lung otherwise clear lungs on my interpretation.  No follow-up documentation/imaging that can be observed for this finding.    PMH: Tobacco abuse Surgical history: Hysterectomy, lower back surgery, carpal tunnel, Chiari malformation Family history: Mother with hypertension, hyperlipidemia, father with CAD Social history: Lives in [place], current smoker, about quarter pack a day   Questionaires / Pulmonary Flowsheets:   ACT:  No flowsheet data found.  MMRC: mMRC Dyspnea Scale mMRC Score  11/18/2020 1    Epworth:  No flowsheet data found.  Tests:    FENO:  No results found for: NITRICOXIDE  PFT: PFT Results Latest Ref Rng & Units 12/03/2020  FVC-Pre L 2.73  FVC-Predicted Pre % 90  FVC-Post L 2.63  FVC-Predicted Post % 87  Pre FEV1/FVC % % 84  Post FEV1/FCV % % 86  FEV1-Pre L 2.28  FEV1-Predicted Pre % 95  FEV1-Post L 2.27  DLCO uncorrected ml/min/mmHg 18.29  DLCO UNC% % 100  DLCO corrected ml/min/mmHg 18.29  DLCO COR %Predicted % 100  DLVA Predicted % 104  TLC L 4.16  TLC % Predicted % 93  RV % Predicted % 83    WALK:  No flowsheet data found.  Imaging: Most recent chest x-ray 06/2020 reviewed and as per EMR and discussion this note DG Chest 2 View  Result Date: 11/20/2020 CLINICAL DATA:  Left upper lobe pulmonary nodule. EXAM: CHEST - 2 VIEW COMPARISON:  06/30/2020 FINDINGS: The lungs are clear without focal pneumonia, edema, pneumothorax or pleural effusion. The nodular density seen in the left lung apex on the previous study is likely obscured by the left clavicle on today's exam. The cardiopericardial silhouette is within normal limits for size. The visualized bony structures of the thorax show no acute abnormality. IMPRESSION: Nodular density seen in the left apex previously may be obscured by the left clavicle given differential positioning on today's study. CT chest without contrast recommended for definitive evaluation. Electronically Signed   By: 07/02/2020 M.D.   On: 11/20/2020 15:33    Lab Results: Personally reviewed, remote labs in  2018 reassuring CBC    Component Value Date/Time   WBC 6.2 07/01/2017 1710   RBC 4.10 07/01/2017 1710   HGB 13.3 07/01/2017 1710   HCT 40.5 07/01/2017 1710   PLT 212 07/01/2017 1710   MCV 98.8 07/01/2017 1710   MCH 32.4 07/01/2017 1710   MCHC 32.8 07/01/2017 1710   RDW 12.9 07/01/2017 1710    BMET    Component Value Date/Time   NA 138 07/01/2017 1710   K 4.1 07/01/2017 1710   CL 107 07/01/2017 1710   CO2 23 07/01/2017 1710   GLUCOSE 94 07/01/2017 1710   BUN  25 (H) 07/01/2017 1710   CREATININE 1.04 (H) 07/01/2017 1710   CALCIUM 9.0 07/01/2017 1710   GFRNONAA >60 07/01/2017 1710   GFRAA >60 07/01/2017 1710    BNP No results found for: BNP  ProBNP No results found for: PROBNP  Specialty Problems   None     Allergies  Allergen Reactions  . Lisinopril Anaphylaxis, Rash and Swelling    Other reaction(s): lip swelling  . Sulfa Antibiotics Hives  . Atorvastatin     Other reaction(s): myalgia  . Chantix [Varenicline]     Other reaction(s): depression, headache  . Rosanil Cleanser [Sulfacetamide Sodium-Sulfur]     Other reaction(s): hives  . Sulfamethoxazole Rash    Immunization History  Administered Date(s) Administered  . Influenza,inj,Quad PF,6+ Mos 06/20/2018, 06/30/2020  . Influenza-Unspecified 08/02/2017  . PFIZER(Purple Top)SARS-COV-2 Vaccination 09/18/2019, 10/09/2019  . Pneumococcal Polysaccharide-23 01/26/2017, 07/24/2018    Past Medical History:  Diagnosis Date  . Chiari malformation type I (HCC)   . Coronary artery disease   . Head pain   . Hypertension   . Tinnitus     Tobacco History: Social History   Tobacco Use  Smoking Status Current Every Day Smoker  . Packs/day: 0.25  . Types: Cigarettes  Smokeless Tobacco Never Used   Ready to quit: Not Answered Counseling given: Not Answered    Outpatient Encounter Medications as of 11/18/2020  Medication Sig  . ALPRAZolam (XANAX) 1 MG tablet Take 1-2 tablets 30 minutes prior to MRI, may repeat once as needed. Must have driver.  Marland Kitchen amLODipine (NORVASC) 2.5 MG tablet Take 2.5 mg by mouth daily.  . chlorthalidone (HYGROTON) 25 MG tablet Take 25 mg by mouth every morning.  . gabapentin (NEURONTIN) 800 MG tablet Take 800 mg by mouth in the morning, at noon, in the evening, and at bedtime. Reports taking 1-4 times daily, depending on symptoms.  Marland Kitchen losartan (COZAAR) 50 MG tablet Take 50 mg by mouth every evening.  . Multiple Vitamin (MULTIVITAMIN WITH MINERALS) TABS  tablet Take 1 tablet by mouth daily.  . propranolol (INDERAL) 80 MG tablet Take 80 mg by mouth 2 (two) times daily.  . rosuvastatin (CRESTOR) 5 MG tablet Take 5 mg by mouth 3 (three) times a week.  . SUMAtriptan (IMITREX) 50 MG tablet Take 1 tablet (50 mg total) by mouth every 2 (two) hours as needed. May repeat in 2 hours if headache persists or recurs.  Marland Kitchen telmisartan (MICARDIS) 40 MG tablet Take 40 mg by mouth daily.  Marland Kitchen tiZANidine (ZANAFLEX) 4 MG tablet Take 4 mg by mouth daily.  . [DISCONTINUED] buPROPion (WELLBUTRIN XL) 150 MG 24 hr tablet Take 1 tablet (150 mg total) by mouth daily.  . [DISCONTINUED] buPROPion (WELLBUTRIN XL) 300 MG 24 hr tablet Take 1 tablet (300 mg total) by mouth daily.  . [DISCONTINUED] DULoxetine (CYMBALTA) 60 MG capsule Take 1 capsule (60  mg total) by mouth daily.  . [DISCONTINUED] zolpidem (AMBIEN CR) 12.5 MG CR tablet Take 1 tablet (12.5 mg total) by mouth at bedtime as needed for sleep.  . [DISCONTINUED] zolpidem (AMBIEN) 10 MG tablet Take 1 tablet (10 mg total) by mouth at bedtime as needed for sleep.   No facility-administered encounter medications on file as of 11/18/2020.     Review of Systems  Review of Systems  No chest pain with exertion.  No orthopnea or PND.  No lower extremity swelling.  Comprehensive review of systems otherwise negative. Physical Exam  BP 122/74   Pulse 67   Temp 97.6 F (36.4 C) (Temporal)   Ht 4\' 11"  (1.499 m)   Wt 169 lb 6.4 oz (76.8 kg)   SpO2 95% Comment: on RA  BMI 34.21 kg/m   Wt Readings from Last 5 Encounters:  11/18/20 169 lb 6.4 oz (76.8 kg)  10/01/20 172 lb (78 kg)  07/01/20 175 lb (79.4 kg)  07/01/17 175 lb (79.4 kg)    BMI Readings from Last 5 Encounters:  11/18/20 34.21 kg/m  10/01/20 34.74 kg/m  07/01/20 34.18 kg/m  07/01/17 35.35 kg/m     Physical Exam General: Thin, no acute distress Eyes: EOMI, no icterus Neck: Supple no JVP Cardiovascular: Regular rate and rhythm, no murmur Pulmonary:  Clear to auscultation bilaterally, no wheeze or crackle Abdomen: Nondistended, bowel sounds present MSK: No synovitis, no joint effusion Neuro: Normal gait, no weakness Psych: Normal mood, full affect   Assessment & Plan:   DOE/Cough: unclear etiology, possible asthma but denies atopic symptoms. Will obtain PFTs to further evaluate. CXR ordered.   Difficulty blowing in device to start car: PFTs to see if flow limitation.    Return in about 3 months (around 02/18/2021).   04/20/2021, MD 12/15/2020

## 2021-02-25 ENCOUNTER — Other Ambulatory Visit: Payer: Self-pay | Admitting: Adult Health

## 2021-02-25 DIAGNOSIS — G4700 Insomnia, unspecified: Secondary | ICD-10-CM

## 2021-02-28 ENCOUNTER — Other Ambulatory Visit: Payer: Self-pay | Admitting: Adult Health

## 2021-02-28 DIAGNOSIS — G4700 Insomnia, unspecified: Secondary | ICD-10-CM

## 2021-03-02 ENCOUNTER — Other Ambulatory Visit: Payer: Self-pay | Admitting: Adult Health

## 2021-03-02 DIAGNOSIS — G4700 Insomnia, unspecified: Secondary | ICD-10-CM

## 2021-03-02 NOTE — Telephone Encounter (Signed)
Last filled 7/20 appt on 11/16

## 2021-03-05 ENCOUNTER — Other Ambulatory Visit: Payer: Self-pay

## 2021-03-05 ENCOUNTER — Telehealth: Payer: Self-pay | Admitting: Adult Health

## 2021-03-05 DIAGNOSIS — G4700 Insomnia, unspecified: Secondary | ICD-10-CM

## 2021-03-05 NOTE — Telephone Encounter (Signed)
Pt lvm needing refill for Zolpidem 10mg . States that pharmacy told her it could be filled either on the 19th or 20th. Pls Rtc [phone]. Pharmacy [facility] [address] [place]

## 2021-03-05 NOTE — Telephone Encounter (Signed)
Pended.

## 2021-03-06 ENCOUNTER — Other Ambulatory Visit: Payer: Self-pay | Admitting: Adult Health

## 2021-03-06 DIAGNOSIS — G4700 Insomnia, unspecified: Secondary | ICD-10-CM

## 2021-03-06 NOTE — Telephone Encounter (Signed)
Last filled 7/20 appt 11/16

## 2021-03-18 ENCOUNTER — Other Ambulatory Visit: Payer: Self-pay | Admitting: Adult Health

## 2021-03-18 DIAGNOSIS — F331 Major depressive disorder, recurrent, moderate: Secondary | ICD-10-CM

## 2021-03-18 DIAGNOSIS — F411 Generalized anxiety disorder: Secondary | ICD-10-CM

## 2021-04-03 ENCOUNTER — Other Ambulatory Visit: Payer: Self-pay | Admitting: Psychiatry

## 2021-04-03 DIAGNOSIS — G4700 Insomnia, unspecified: Secondary | ICD-10-CM

## 2021-04-03 NOTE — Telephone Encounter (Signed)
Apt 11/16 

## 2021-05-01 NOTE — H&P (Signed)
Autumn Greene is an 55 y.o. female.   Chief Complaint: BILATERAL THUMB PAIN  HPI: The patient is a 55 y/o right hand dominant female who has had pain in both thumbs for several years. In 2018, she had a CMC reconstruction on the left side which provided her with pain relief. She would like to undergo the procedure for the left side since conservative measures are not helping anymore.  She is here today for surgery.  She denies chest pain, shortness of breath, fever, chills, nausea, vomiting, or diarrhea.   Past Medical History:  Diagnosis Date   Chiari malformation type I (HCC)    Coronary artery disease    Head pain    Hypertension    Tinnitus     Past Surgical History:  Procedure Laterality Date   ABDOMINAL HYSTERECTOMY     BACK SURGERY     CARPAL TUNNEL RELEASE Bilateral    chiari malformation     thumb surgery Left    TONSILLECTOMY      Family History  Problem Relation Age of Onset   Hypertension Mother    Hyperlipidemia Mother    Heart disease Father    Social History:  reports that she has been smoking cigarettes. She has been smoking an average of .25 packs per day. She has never used smokeless tobacco. She reports current alcohol use. She reports that she does not use drugs.  Allergies:  Allergies  Allergen Reactions   Lisinopril Anaphylaxis, Rash and Swelling    Other reaction(s): lip swelling   Sulfa Antibiotics Hives   Atorvastatin     Other reaction(s): myalgia   Chantix [Varenicline]     Other reaction(s): depression, headache   Rosanil Cleanser [Sulfacetamide Sodium-Sulfur]     Other reaction(s): hives   Sulfamethoxazole Rash    No medications prior to admission.    No results found for this or any previous visit (from the past 48 hour(s)). No results found.  ROS NO RECENT ILLNESSES OR HOSPITALIZATIONS  There were no vitals taken for this visit. Physical Exam  General Appearance:  Alert, cooperative, no distress, appears stated age   Head:  Normocephalic, without obvious abnormality, atraumatic  Eyes:  Pupils equal, conjunctiva/corneas clear,         Throat: Lips, mucosa, and tongue normal; teeth and gums normal  Neck: No visible masses     Lungs:   respirations unlabored  Chest Wall:  No tenderness or deformity  Heart:  Regular rate and rhythm,  Abdomen:   Soft, non-tender,         Extremities: BUE - SWELLING AT THE FIRST CMC JOINTS BILATERALLY WITHOUT ERYTHEMA OR ECCHYMOSIS. SENSATION INTACT TO LIGHT TOUCH DISTALLY. CAPILLARY REFILL LESS THAN 2 SECONDS. ABLE TO MAKE A FULL FIST, CROSS FINGERS, AND ABDUCT THUMB. DISCOMFORT WITH OPPOSITION OF THE THUMB.   Pulses: 2+ and symmetric  Skin: Skin color, texture, turgor normal, no rashes or lesions     Neurologic: Normal     Assessment/Plan LEFT THUMB CARPOMETACARPAL OSTEOARTHRITIS     - LEFT THUMB CMC ARTHROPLASTY WITH TENDON TRANSFER   RIGHT THUMB CARPOMETACARPAL OSTEOARTHRITIS     - RIGHT THUMB CMC JOINT INJECTION    WE ARE PLANNING SURGERY FOR YOUR UPPER EXTREMITY. THE RISKS AND BENEFITS OF SURGERY INCLUDE BUT NOT LIMITED TO BLEEDING INFECTION, DAMAGE TO NEARBY NERVES ARTERIES TENDONS, FAILURE OF SURGERY TO ACCOMPLISH ITS INTENDED GOALS, PERSISTENT SYMPTOMS AND NEED FOR FURTHER SURGICAL INTERVENTION. WITH THIS IN MIND WE WILL PROCEED. I  HAVE DISCUSSED WITH THE PATIENT THE PRE AND POSTOPERATIVE REGIMEN AND THE DOS AND DON'TS. PT VOICED UNDERSTANDING AND INFORMED CONSENT SIGNED.   R/B/A DISCUSSED WITH PT IN OFFICE.  PT VOICED UNDERSTANDING OF PLAN CONSENT SIGNED DAY OF SURGERY PT SEEN AND EXAMINED PRIOR TO OPERATIVE PROCEDURE/DAY OF SURGERY SITE MARKED. QUESTIONS ANSWERED WILL GO HOME FOLLOWING SURGERY   Autumn Greene [facility] MD  Karma Greaser 05/01/2021, 9:09 AM

## 2021-05-06 ENCOUNTER — Other Ambulatory Visit: Payer: Self-pay

## 2021-05-06 ENCOUNTER — Encounter (HOSPITAL_BASED_OUTPATIENT_CLINIC_OR_DEPARTMENT_OTHER): Payer: Self-pay | Admitting: Orthopedic Surgery

## 2021-05-08 ENCOUNTER — Encounter (HOSPITAL_BASED_OUTPATIENT_CLINIC_OR_DEPARTMENT_OTHER)
Admission: RE | Admit: 2021-05-08 | Discharge: 2021-05-08 | Disposition: A | Discharge status: Home / Self Care | Payer: 59 | Source: Ambulatory Visit | Attending: Orthopedic Surgery | Admitting: Orthopedic Surgery

## 2021-05-08 DIAGNOSIS — Z01812 Encounter for preprocedural laboratory examination: Secondary | ICD-10-CM | POA: Insufficient documentation

## 2021-05-08 LAB — BASIC METABOLIC PANEL
Anion gap: 8 (ref 5–15)
BUN: 12 mg/dL (ref 6–20)
CO2: 28 mmol/L (ref 22–32)
Calcium: 9.7 mg/dL (ref 8.9–10.3)
Chloride: 99 mmol/L (ref 98–111)
Creatinine, Ser: 1.07 mg/dL — ABNORMAL HIGH (ref 0.44–1.00)
GFR, Estimated: >60 mL/min (ref >60)
Glucose, Bld: 81 mg/dL (ref 70–99)
Potassium: 3.7 mmol/L (ref 3.5–5.1)
Sodium: 135 mmol/L (ref 135–145)

## 2021-05-08 NOTE — Progress Notes (Signed)

## 2021-05-11 ENCOUNTER — Ambulatory Visit (HOSPITAL_BASED_OUTPATIENT_CLINIC_OR_DEPARTMENT_OTHER): Payer: 59

## 2021-05-11 ENCOUNTER — Encounter (HOSPITAL_BASED_OUTPATIENT_CLINIC_OR_DEPARTMENT_OTHER)
Admission: RE | Disposition: A | Discharge status: Home / Self Care | Payer: Self-pay | Source: Home / Self Care | Attending: Orthopedic Surgery

## 2021-05-11 ENCOUNTER — Ambulatory Visit (HOSPITAL_BASED_OUTPATIENT_CLINIC_OR_DEPARTMENT_OTHER): Payer: 59 | Admitting: Anesthesiology

## 2021-05-11 ENCOUNTER — Ambulatory Visit (HOSPITAL_BASED_OUTPATIENT_CLINIC_OR_DEPARTMENT_OTHER)
Admission: RE | Admit: 2021-05-11 | Discharge: 2021-05-11 | Disposition: A | Discharge status: Home / Self Care | Payer: 59 | Attending: Orthopedic Surgery | Admitting: Orthopedic Surgery

## 2021-05-11 ENCOUNTER — Other Ambulatory Visit: Payer: Self-pay

## 2021-05-11 ENCOUNTER — Encounter (HOSPITAL_BASED_OUTPATIENT_CLINIC_OR_DEPARTMENT_OTHER): Payer: Self-pay | Admitting: Orthopedic Surgery

## 2021-05-11 DIAGNOSIS — F17210 Nicotine dependence, cigarettes, uncomplicated: Secondary | ICD-10-CM | POA: Insufficient documentation

## 2021-05-11 DIAGNOSIS — M180 Bilateral primary osteoarthritis of first carpometacarpal joints: Secondary | ICD-10-CM | POA: Insufficient documentation

## 2021-05-11 DIAGNOSIS — Z888 Allergy status to other drugs, medicaments and biological substances status: Secondary | ICD-10-CM | POA: Diagnosis not present

## 2021-05-11 DIAGNOSIS — I10 Essential (primary) hypertension: Secondary | ICD-10-CM

## 2021-05-11 DIAGNOSIS — Z882 Allergy status to sulfonamides status: Secondary | ICD-10-CM | POA: Insufficient documentation

## 2021-05-11 DIAGNOSIS — M189 Osteoarthritis of first carpometacarpal joint, unspecified: Secondary | ICD-10-CM | POA: Diagnosis present

## 2021-05-11 DIAGNOSIS — M1812 Unilateral primary osteoarthritis of first carpometacarpal joint, left hand: Secondary | ICD-10-CM

## 2021-05-11 HISTORY — DX: Depression, unspecified: F32.A

## 2021-05-11 HISTORY — PX: FINGER ARTHROSCOPY WITH CARPOMETACARPEL (CMC) ARTHROPLASTY: SHX5629

## 2021-05-11 HISTORY — PX: STERIOD INJECTION: SHX5046

## 2021-05-11 HISTORY — DX: Anxiety disorder, unspecified: F41.9

## 2021-05-11 SURGERY — FINGER ARTHROSCOPY WITH CARPOMETACARPEL (CMC) ARTHROPLASTY
Anesthesia: Monitor Anesthesia Care | Site: Thumb | Laterality: Right

## 2021-05-11 MED ORDER — BETAMETHASONE SOD PHOS & ACET 6 (3-3) MG/ML IJ SUSP
INTRAMUSCULAR | Status: AC
  Filled 2021-05-11: qty 5

## 2021-05-11 MED ORDER — OXYCODONE HCL 5 MG/5ML PO SOLN: 5.0000 mg | Freq: Once | ORAL | Status: DC | PRN

## 2021-05-11 MED ORDER — HYDROMORPHONE HCL 1 MG/ML IJ SOLN: 0.2500 mg | INTRAMUSCULAR | Status: DC | PRN

## 2021-05-11 MED ORDER — MIDAZOLAM HCL 5 MG/5ML IJ SOLN
INTRAMUSCULAR | Status: DC | PRN
  Administered 2021-05-11: 2 mg via INTRAVENOUS

## 2021-05-11 MED ORDER — BETAMETHASONE SOD PHOS & ACET 6 (3-3) MG/ML IJ SUSP
INTRAMUSCULAR | Status: DC | PRN
  Administered 2021-05-11: 6 mg via INTRA_ARTICULAR

## 2021-05-11 MED ORDER — ONDANSETRON HCL 4 MG/2ML IJ SOLN
INTRAMUSCULAR | Status: DC | PRN
  Administered 2021-05-11: 4 mg via INTRAVENOUS

## 2021-05-11 MED ORDER — FENTANYL CITRATE (PF) 100 MCG/2ML IJ SOLN
INTRAMUSCULAR | Status: AC
  Filled 2021-05-11: qty 2

## 2021-05-11 MED ORDER — OXYCODONE HCL 5 MG PO TABS: 5.0000 mg | ORAL_TABLET | Freq: Once | ORAL | Status: DC | PRN

## 2021-05-11 MED ORDER — LACTATED RINGERS IV SOLN: INTRAVENOUS | Status: DC

## 2021-05-11 MED ORDER — PROPOFOL 500 MG/50ML IV EMUL
INTRAVENOUS | Status: DC | PRN
  Administered 2021-05-11: 100 ug/kg/min via INTRAVENOUS

## 2021-05-11 MED ORDER — PROPOFOL 500 MG/50ML IV EMUL
INTRAVENOUS | Status: AC
  Filled 2021-05-11: qty 50

## 2021-05-11 MED ORDER — PROPOFOL 10 MG/ML IV BOLUS
INTRAVENOUS | Status: DC | PRN
Start: 2021-05-11 — End: 2021-05-11
  Administered 2021-05-11: 20 mg via INTRAVENOUS

## 2021-05-11 MED ORDER — MIDAZOLAM HCL 2 MG/2ML IJ SOLN
INTRAMUSCULAR | Status: AC
  Filled 2021-05-11: qty 2

## 2021-05-11 MED ORDER — BUPIVACAINE HCL (PF) 0.25 % IJ SOLN
INTRAMUSCULAR | Status: DC | PRN
  Administered 2021-05-11: 1 mL

## 2021-05-11 MED ORDER — METHYLPREDNISOLONE ACETATE 80 MG/ML IJ SUSP
INTRAMUSCULAR | Status: AC
  Filled 2021-05-11: qty 1

## 2021-05-11 MED ORDER — DOCUSATE SODIUM 100 MG PO CAPS: 100.0000 mg | ORAL_CAPSULE | Freq: Every day | ORAL | 2 refills | Status: AC | PRN

## 2021-05-11 MED ORDER — 0.9 % SODIUM CHLORIDE (POUR BTL) OPTIME
TOPICAL | Status: DC | PRN
Start: 2021-05-11 — End: 2021-05-11
  Administered 2021-05-11: 50 mL

## 2021-05-11 MED ORDER — LIDOCAINE HCL (PF) 1 % IJ SOLN
INTRAMUSCULAR | Status: AC
  Filled 2021-05-11: qty 30

## 2021-05-11 MED ORDER — METHYLPREDNISOLONE ACETATE 40 MG/ML IJ SUSP
INTRAMUSCULAR | Status: AC
  Filled 2021-05-11: qty 1

## 2021-05-11 MED ORDER — ROPIVACAINE HCL 5 MG/ML IJ SOLN
INTRAMUSCULAR | Status: DC | PRN
  Administered 2021-05-11: 30 mL via PERINEURAL

## 2021-05-11 MED ORDER — ONDANSETRON HCL 4 MG/2ML IJ SOLN
INTRAMUSCULAR | Status: AC
  Filled 2021-05-11: qty 2

## 2021-05-11 MED ORDER — CEFAZOLIN SODIUM-DEXTROSE 2-4 GM/100ML-% IV SOLN
2.0000 g | INTRAVENOUS | Status: AC
  Administered 2021-05-11: 2 g via INTRAVENOUS

## 2021-05-11 MED ORDER — CEFAZOLIN SODIUM-DEXTROSE 2-4 GM/100ML-% IV SOLN
INTRAVENOUS | Status: AC
  Filled 2021-05-11: qty 100

## 2021-05-11 MED ORDER — OXYCODONE-ACETAMINOPHEN 5-325 MG PO TABS: 1.0000 | ORAL_TABLET | ORAL | 0 refills | Status: AC | PRN

## 2021-05-11 MED ORDER — MIDAZOLAM HCL 2 MG/2ML IJ SOLN
2.0000 mg | Freq: Once | INTRAMUSCULAR | Status: AC
  Administered 2021-05-11: 2 mg via INTRAVENOUS

## 2021-05-11 MED ORDER — LIDOCAINE 2% (20 MG/ML) 5 ML SYRINGE
INTRAMUSCULAR | Status: AC
  Filled 2021-05-11: qty 5

## 2021-05-11 MED ORDER — FENTANYL CITRATE (PF) 100 MCG/2ML IJ SOLN
100.0000 ug | Freq: Once | INTRAMUSCULAR | Status: AC
  Administered 2021-05-11: 100 ug via INTRAVENOUS

## 2021-05-11 MED ORDER — PROMETHAZINE HCL 25 MG/ML IJ SOLN: 6.2500 mg | INTRAMUSCULAR | Status: DC | PRN

## 2021-05-11 SURGICAL SUPPLY — 81 items
BLADE CLIPPER SURG (BLADE) ×2 IMPLANT
BLADE MINI RND TIP GREEN BEAV (BLADE) IMPLANT
BLADE PRESCISION 7.0X.51X18.5 (BLADE) ×2 IMPLANT
BLADE SURG 15 STRL LF DISP TIS (BLADE) ×4 IMPLANT
BLADE SURG 15 STRL SS (BLADE) ×6
BNDG CMPR 9X4 STRL LF SNTH (GAUZE/BANDAGES/DRESSINGS) ×2
BNDG CONFORM 3 STRL LF (GAUZE/BANDAGES/DRESSINGS) ×3 IMPLANT
BNDG ELASTIC 2X5.8 VLCR STR LF (GAUZE/BANDAGES/DRESSINGS) ×1 IMPLANT
BNDG ELASTIC 3X5.8 VLCR STR LF (GAUZE/BANDAGES/DRESSINGS) ×6 IMPLANT
BNDG ESMARK 4X9 LF (GAUZE/BANDAGES/DRESSINGS) ×3 IMPLANT
BRUSH SCRUB EZ PLAIN DRY (MISCELLANEOUS) ×1 IMPLANT
CANISTER SUCT 1200ML W/VALVE (MISCELLANEOUS) ×2 IMPLANT
CORD BIPOLAR FORCEPS 12FT (ELECTRODE) ×3 IMPLANT
COVER BACK TABLE 60X90IN (DRAPES) ×3 IMPLANT
COVER MAYO STAND STRL (DRAPES) ×3 IMPLANT
CUFF TOURN SGL QUICK 18X4 (TOURNIQUET CUFF) IMPLANT
DECANTER SPIKE VIAL GLASS SM (MISCELLANEOUS) IMPLANT
DRAPE EXTREMITY T 121X128X90 (DISPOSABLE) ×3 IMPLANT
DRAPE OEC MINIVIEW 54X84 (DRAPES) ×3 IMPLANT
DRAPE SURG 17X23 STRL (DRAPES) ×2 IMPLANT
DRSG EMULSION OIL 3X3 NADH (GAUZE/BANDAGES/DRESSINGS) ×1 IMPLANT
GAUZE 4X4 16PLY ~~LOC~~+RFID DBL (SPONGE) ×1 IMPLANT
GLOVE SRG 8 PF TXTR STRL LF DI (GLOVE) ×2 IMPLANT
GLOVE SURG ENC MOIS LTX SZ6.5 (GLOVE) ×2 IMPLANT
GLOVE SURG LTX SZ6.5 (GLOVE) ×3 IMPLANT
GLOVE SURG LTX SZ8 (GLOVE) ×3 IMPLANT
GLOVE SURG POLYISO LF SZ6.5 (GLOVE) ×1 IMPLANT
GLOVE SURG UNDER POLY LF SZ6.5 (GLOVE) ×3 IMPLANT
GLOVE SURG UNDER POLY LF SZ7 (GLOVE) ×2 IMPLANT
GLOVE SURG UNDER POLY LF SZ8 (GLOVE) ×6
GLOVE SURG UNDER POLY LF SZ8.5 (GLOVE) ×2 IMPLANT
GOWN STRL REUS W/ TWL LRG LVL3 (GOWN DISPOSABLE) ×4 IMPLANT
GOWN STRL REUS W/ TWL XL LVL3 (GOWN DISPOSABLE) ×2 IMPLANT
GOWN STRL REUS W/TWL 2XL LVL3 (GOWN DISPOSABLE) ×1 IMPLANT
GOWN STRL REUS W/TWL LRG LVL3 (GOWN DISPOSABLE) ×6
GOWN STRL REUS W/TWL XL LVL3 (GOWN DISPOSABLE) ×3
KIT ASCP FXDISP 3X8XBTNDS (KITS) IMPLANT
KIT BIO-TENODESIS 3X8 DISP (KITS)
NDL HYPO 25X1 1.5 SAFETY (NEEDLE) IMPLANT
NEEDLE HYPO 25X1 1.5 SAFETY (NEEDLE) ×3 IMPLANT
NS IRRIG 1000ML POUR BTL (IV SOLUTION) ×3 IMPLANT
PACK BASIN DAY SURGERY FS (CUSTOM PROCEDURE TRAY) ×3 IMPLANT
PAD CAST 3X4 CTTN HI CHSV (CAST SUPPLIES) ×4 IMPLANT
PADDING CAST ABS 3INX4YD NS (CAST SUPPLIES) ×2
PADDING CAST ABS COTTON 3X4 (CAST SUPPLIES) ×4 IMPLANT
PADDING CAST COTTON 3X4 STRL (CAST SUPPLIES) ×6
PASSER SUT SWANSON 36MM LOOP (INSTRUMENTS) IMPLANT
SCOTCHCAST PLUS 3X4 WHITE (CAST SUPPLIES) IMPLANT
SCOTCHCAST PLUS 4X4 WHITE (CAST SUPPLIES) IMPLANT
SHEET MEDIUM DRAPE 40X70 STRL (DRAPES) IMPLANT
SLEEVE SCD COMPRESS KNEE MED (STOCKING) ×3 IMPLANT
SLING ARM FOAM STRAP MED (SOFTGOODS) ×1 IMPLANT
SPLINT FIBERGLASS 3X35 (CAST SUPPLIES) ×1 IMPLANT
SPONGE SURGIFOAM ABS GEL 12-7 (HEMOSTASIS) ×1 IMPLANT
STOCKINETTE 4X48 STRL (DRAPES) ×3 IMPLANT
STOCKINETTE SYNTHETIC 3 UNSTER (CAST SUPPLIES) IMPLANT
STRIP CLOSURE SKIN 1/2X4 (GAUZE/BANDAGES/DRESSINGS) ×1 IMPLANT
SUCTION FRAZIER HANDLE 10FR (MISCELLANEOUS) ×3
SUCTION TUBE FRAZIER 10FR DISP (MISCELLANEOUS) ×2 IMPLANT
SUT BONE WAX W31G (SUTURE) IMPLANT
SUT ETHIBOND 3-0 V-5 (SUTURE) IMPLANT
SUT ETHILON 4 0 PS 2 18 (SUTURE) IMPLANT
SUT FIBERWIRE 3-0 18 TAPR NDL (SUTURE)
SUT FIBERWIRE 4-0 18 TAPR NDL (SUTURE) ×3
SUT MNCRL AB 4-0 PS2 18 (SUTURE) ×2 IMPLANT
SUT PROLENE 4 0 PS 2 18 (SUTURE) ×4 IMPLANT
SUT VIC AB 2-0 SH 27 (SUTURE)
SUT VIC AB 2-0 SH 27XBRD (SUTURE) IMPLANT
SUT VIC AB 3-0 SH 27 (SUTURE) ×3
SUT VIC AB 3-0 SH 27X BRD (SUTURE) ×2 IMPLANT
SUTURE FIBERWR 3-0 18 TAPR NDL (SUTURE) IMPLANT
SUTURE FIBERWR 4-0 18 TAPR NDL (SUTURE) IMPLANT
SYR BULB EAR ULCER 3OZ GRN STR (SYRINGE) ×3 IMPLANT
SYR CONTROL 10ML LL (SYRINGE) ×1 IMPLANT
SYSTEM IMPLANT TIGHTROPE MINI (Anchor) ×1 IMPLANT
TAPE SURG TRANSPORE 1 IN (GAUZE/BANDAGES/DRESSINGS) ×2 IMPLANT
TAPE SURGICAL TRANSPORE 1 IN (GAUZE/BANDAGES/DRESSINGS) ×3
TOWEL GREEN STERILE FF (TOWEL DISPOSABLE) ×6 IMPLANT
TRAY DSU PREP LF (CUSTOM PROCEDURE TRAY) ×3 IMPLANT
TUBE CONNECTING 20X1/4 (TUBING) ×3 IMPLANT
UNDERPAD 30X36 HEAVY ABSORB (UNDERPADS AND DIAPERS) ×3 IMPLANT

## 2021-05-11 NOTE — Anesthesia Postprocedure Evaluation (Signed)
Anesthesia Post Note  Patient: Autumn Greene  Procedure(s) Performed: Left thumb CMC arthroplasty with tendon transfer (Left: Thumb) RIGHT THUMB CARPOMETACARPAL STEROID JOINT INJECTION (Right: Thumb)     Patient location during evaluation: PACU Anesthesia Type: Regional Level of consciousness: awake and alert Pain management: pain level controlled Vital Signs Assessment: post-procedure vital signs reviewed and stable Respiratory status: spontaneous breathing, nonlabored ventilation and respiratory function stable Cardiovascular status: blood pressure returned to baseline and stable Postop Assessment: no apparent nausea or vomiting Anesthetic complications: no   No notable events documented.  Last Vitals:  Vitals:   05/11/21 1624 05/11/21 1630  BP: 137/87 (!) 151/80  Pulse: 67 61  Resp: 18 16  Temp: 36.4 C   SpO2: (!) 89% 97%    Last Pain:  Vitals:   05/11/21 1624  TempSrc:   PainSc: 0-No pain                 Lowella Curb

## 2021-05-11 NOTE — Anesthesia Preprocedure Evaluation (Signed)
Anesthesia Evaluation  Patient identified by MRN, date of birth, ID band Patient awake    Reviewed: Allergy & Precautions, NPO status , Patient's Chart, lab work & pertinent test results  Airway Mallampati: II  TM Distance: >3 FB Neck ROM: Full    Dental no notable dental hx.    Pulmonary neg pulmonary ROS, Current Smoker and Patient abstained from smoking.,    Pulmonary exam normal breath sounds clear to auscultation       Cardiovascular hypertension, Pt. on medications + CAD  Normal cardiovascular exam Rhythm:Regular Rate:Normal     Neuro/Psych  Headaches, Anxiety Depression negative psych ROS   GI/Hepatic negative GI ROS, Neg liver ROS,   Endo/Other  negative endocrine ROS  Renal/GU negative Renal ROS  negative genitourinary   Musculoskeletal negative musculoskeletal ROS (+)   Abdominal (+) + obese,   Peds negative pediatric ROS (+)  Hematology negative hematology ROS (+)   Anesthesia Other Findings   Reproductive/Obstetrics negative OB ROS                             Anesthesia Physical Anesthesia Plan  ASA: 3  Anesthesia Plan: MAC and Regional   Post-op Pain Management:    Induction: Intravenous  PONV Risk Score and Plan: 1 and Ondansetron and Treatment may vary due to age or medical condition  Airway Management Planned: Simple Face Mask  Additional Equipment:   Intra-op Plan:   Post-operative Plan:   Informed Consent: I have reviewed the patients History and Physical, chart, labs and discussed the procedure including the risks, benefits and alternatives for the proposed anesthesia with the patient or authorized representative who has indicated his/her understanding and acceptance.     Dental advisory given  Plan Discussed with: CRNA  Anesthesia Plan Comments:         Anesthesia Quick Evaluation

## 2021-05-11 NOTE — Op Note (Signed)
PREOPERATIVE DIAGNOSIS: Left thumb bone-on-bone carpometacarpal arthritis Right thumb bone-on-bone carpometacarpal arthritis  POSTOPERATIVE DIAGNOSIS: Same  ATTENDING SURGEON: Dr. Bradly Bienenstock who scrubbed and present for the entire procedure  ASSISTANT SURGEON: Lambert Mody, PA-C was scrubbed and necessary for trapezium excision tendon harvesting arthroplasty closure and splinting in a timely fashion  ANESTHESIA: Regional with IV sedation  OPERATIVE PROCEDURE: Left thumb trapezium excision Left thumb carpometacarpal arthroplasty Radiographs 3 views left thumb Right thumb carpometacarpal joint injection  IMPLANTS: Arthrex CMC tight rope  EBL: Minimal  RADIOGRAPHIC INTERPRETATION: AP lateral and oblique views of the thumb do show the [facility] arthroplasty in good position with the metal button fixation in good position  SURGICAL INDICATIONS: Patient is a right-hand-dominant female with bilateral thumb carpometacarpal arthritis.  Patient elected undergo the above procedure.  The risks of surgery include but not limited to bleeding infection damage nearby nerves arteries or tendons loss of motion of the wrist and digits incomplete relief of symptoms and need for further surgical invention.  Signed informed consent obtained on the day of surgery.  SURGICAL TECHNIQUE: Patient was brought identified in the preoperative holding area marked apart a marker made on the left thumb indicate correct operative site.  Patient brought back operating placed supine on the anesthesia table where the regional anesthetic of been administered.  The IV sedation was administered.  Well-padded tourniquet placed on the left brachium and stay with the appropriate drape.  Left upper extremities then prepped and draped normal sterile fashion.  A timeout was called the correct site was identified procedure then begun.  Attention was then turned to the right thumb prior to the left thumb surgery the [facility] of the right side was  injected 1 cc Marcaine 1 cc of Celestone.  Patient tolerated this injection well. Then attention was then turned to the left side.  The previous incision was then marked out.  Longitude incision made directly of the thumb CMC joint.  Dissection carried down through the skin and subcutaneous tissue.  Careful protection of the distal radial sensory branches were then done.  The patient did have the previous [facility] reconstruction.  The patient did not have good appearing or healthy appearing abductor tendons.  Was bit concerned about the quality of the APL tendon transferring.  Following this the interval between the EPB and the EPL was then developed.  Large capsular flaps were then done in the [facility] joint was then exposed.  The patient did have the bone-on-bone osteoarthritis on both the trapezium in thumb metacarpal base with a loss of the articular cartilage.  Following this freer elevator was then placed into the scaphoid trapezium joint.  Careful blunt retractors were then used to retract the radial artery proximally.  Following this trapezium was then excised in a piecemeal fashion.  After 1 bone carpectomy attention was then turned to the placement of the tight rope.  A transverse K wire was then placed across the thumb metacarpal.  The Endobutton was then developed delivered through the thumb metacarpal.  The index metacarpal base was then identified.  Once this was identified the K wire was then placed through a separate past through the index metacarpal and out dorsally.  A counterincision was then made dorsally.  Deep dissection was then carried down to the K wire where the K wire could be well visualized.  Following visualization of the K wire the K wire was driven through the dorsal aspect of the hand protecting the radial sensory branches dorsally.  Once this  was done the suture was then delivered through the index metacarpal.  The Endobutton for the tight rope to complete the construct was then placed  through the suture limbs.  It was tied down nicely with 1 pass and then's position was then confirmed using mini C arm.  This was adjusted with the appropriate tension and then after appropriate tension and using the mini C arm to aid in visualization the button was tied down.  The thumb was placed through full range of motion with good mobility and no areas of impingement.  The joint was then thoroughly irrigated.  Following this Gelfoam was then applied into the arthroplasty site.  The arthroplasty was then closed capsular layer with 4-0 FiberWire and 3-0 Vicryl suture.  The subcutaneous tissues closed with Monocryl and the skin closed a running 4-0 Prolene subcuticular.  Steri-Strips were applied.  Sterile compressive bandage then applied.  The patient placed in a well-padded thumb spica cast taken recovery room in good condition.  Of note intraoperatively the patient is FCR was protected throughout and the patient still have the FCR in continuity at the base of the arthroplasty site.  POSTOPERATIVE PLAN: Patient be discharged to home.  See him back in the office in 2 weeks for wound check suture removal x-rays application of a short arm thumb spica cast.  Total immobilization for 6 weeks.  Radiographs at each visit.  Placed the therapy order the first postoperative visit.  Begin a CMC arthroplasty protocol at the 6-week mark.

## 2021-05-11 NOTE — Anesthesia Procedure Notes (Signed)
Anesthesia Regional Block: Supraclavicular block   Pre-Anesthetic Checklist: , timeout performed,  Correct Patient, Correct Site, Correct Laterality,  Correct Procedure, Correct Position, site marked,  Risks and benefits discussed,  Surgical consent,  Pre-op evaluation,  At surgeon's request and post-op pain management  Laterality: Left  Prep: chloraprep       Needles:  Injection technique: Single-shot  Needle Type: Stimiplex     Needle Length: 9cm  Needle Gauge: 21     Additional Needles:   Procedures:,,,, ultrasound used (permanent image in chart),,    Narrative:  Start time: 05/11/2021 12:35 PM End time: 05/11/2021 12:40 PM Injection made incrementally with aspirations every 5 mL.  Performed by: Personally  Anesthesiologist: Lowella Curb, MD

## 2021-05-11 NOTE — Discharge Instructions (Addendum)
KEEP BANDAGE CLEAN AND DRY CALL OFFICE FOR F/U APPT [phone] RX SENT TO [facility] [place] KEEP HAND ELEVATED ABOVE HEART OK TO APPLY ICE TO OPERATIVE AREA CONTACT OFFICE IF ANY WORSENING PAIN OR CONCERNS.     Post Anesthesia Home Care Instructions  Activity: Get plenty of rest for the remainder of the day. A responsible individual must stay with you for 24 hours following the procedure.  For the next 24 hours, DO NOT: -Drive a car -Advertising copywriter -Drink alcoholic beverages -Take any medication unless instructed by your physician -Make any legal decisions or sign important papers.  Meals: Start with liquid foods such as gelatin or soup. Progress to regular foods as tolerated. Avoid greasy, spicy, heavy foods. If nausea and/or vomiting occur, drink only clear liquids until the nausea and/or vomiting subsides. Call your physician if vomiting continues.  Special Instructions/Symptoms: Your throat may feel dry or sore from the anesthesia or the breathing tube placed in your throat during surgery. If this causes discomfort, gargle with warm salt water. The discomfort should disappear within 24 hours.  If you had a scopolamine patch placed behind your ear for the management of post- operative nausea and/or vomiting:  1. The medication in the patch is effective for 72 hours, after which it should be removed.  Wrap patch in a tissue and discard in the trash. Wash hands thoroughly with soap and water. 2. You may remove the patch earlier than 72 hours if you experience unpleasant side effects which may include dry mouth, dizziness or visual disturbances. 3. Avoid touching the patch. Wash your hands with soap and water after contact with the patch.       Regional Anesthesia Blocks  1. Numbness or the inability to move the "blocked" extremity may last from 3-48 hours after placement. The length of time depends on the medication injected and your individual response to the medication. If the  numbness is not going away after 48 hours, call your surgeon.  2. The extremity that is blocked will need to be protected until the numbness is gone and the  Strength has returned. Because you cannot feel it, you will need to take extra care to avoid injury. Because it may be weak, you may have difficulty moving it or using it. You may not know what position it is in without looking at it while the block is in effect.  3. For blocks in the legs and feet, returning to weight bearing and walking needs to be done carefully. You will need to wait until the numbness is entirely gone and the strength has returned. You should be able to move your leg and foot normally before you try and bear weight or walk. You will need someone to be with you when you first try to ensure you do not fall and possibly risk injury.  4. Bruising and tenderness at the needle site are common side effects and will resolve in a few days.  5. Persistent numbness or new problems with movement should be communicated to the surgeon or the [facility] [phone] [facility] [phone]).

## 2021-05-11 NOTE — Progress Notes (Signed)
Assisted Dr. Miller with left, ultrasound guided, supraclavicular block. Side rails up, monitors on throughout procedure. See vital signs in flow sheet. Tolerated Procedure well. 

## 2021-05-11 NOTE — Transfer of Care (Signed)
Immediate Anesthesia Transfer of Care Note  Patient: Autumn Greene  Procedure(s) Performed: Left thumb CMC arthroplasty with tendon transfer (Left: Thumb) RIGHT THUMB CARPOMETACARPAL STEROID JOINT INJECTION (Right: Thumb)  Patient Location: PACU  Anesthesia Type:MAC and Regional  Level of Consciousness: awake, alert  and oriented  Airway & Oxygen Therapy: Patient Spontanous Breathing and Patient connected to face mask oxygen  Post-op Assessment: Report given to RN and Post -op Vital signs reviewed and stable  Post vital signs: Reviewed and stable  Last Vitals:  Vitals Value Taken Time  BP    Temp    Pulse 67 05/11/21 1623  Resp    SpO2 89 % 05/11/21 1623  Vitals shown include unvalidated device data.  Last Pain:  Vitals:   05/11/21 1136  TempSrc: Oral  PainSc: 4          Complications: No notable events documented.

## 2021-05-12 ENCOUNTER — Encounter (HOSPITAL_BASED_OUTPATIENT_CLINIC_OR_DEPARTMENT_OTHER): Payer: Self-pay | Admitting: Orthopedic Surgery

## 2021-06-02 ENCOUNTER — Other Ambulatory Visit: Payer: Self-pay | Admitting: Psychiatry

## 2021-06-02 DIAGNOSIS — G4700 Insomnia, unspecified: Secondary | ICD-10-CM

## 2021-06-03 ENCOUNTER — Telehealth (INDEPENDENT_AMBULATORY_CARE_PROVIDER_SITE_OTHER): Payer: 59 | Admitting: Adult Health

## 2021-06-03 ENCOUNTER — Encounter: Payer: Self-pay | Admitting: Adult Health

## 2021-06-03 DIAGNOSIS — F4010 Social phobia, unspecified: Secondary | ICD-10-CM

## 2021-06-03 DIAGNOSIS — F331 Major depressive disorder, recurrent, moderate: Secondary | ICD-10-CM | POA: Diagnosis not present

## 2021-06-03 DIAGNOSIS — F428 Other obsessive-compulsive disorder: Secondary | ICD-10-CM | POA: Diagnosis not present

## 2021-06-03 DIAGNOSIS — G4700 Insomnia, unspecified: Secondary | ICD-10-CM | POA: Diagnosis not present

## 2021-06-03 DIAGNOSIS — F411 Generalized anxiety disorder: Secondary | ICD-10-CM | POA: Diagnosis not present

## 2021-06-03 MED ORDER — BUPROPION HCL ER (XL) 150 MG PO TB24: 150.0000 mg | ORAL_TABLET | Freq: Every day | ORAL | 5 refills | Status: DC

## 2021-06-03 MED ORDER — DULOXETINE HCL 60 MG PO CPEP: 60.0000 mg | ORAL_CAPSULE | Freq: Every day | ORAL | 5 refills | Status: DC

## 2021-06-03 MED ORDER — ZOLPIDEM TARTRATE 10 MG PO TABS: ORAL_TABLET | ORAL | 2 refills | Status: DC

## 2021-06-03 MED ORDER — BUPROPION HCL ER (XL) 300 MG PO TB24: 300.0000 mg | ORAL_TABLET | Freq: Every day | ORAL | 5 refills | Status: DC

## 2021-06-03 NOTE — Progress Notes (Signed)
Autumn Greene [id number] [birth year] 55 y.o.  Virtual Visit via Video Note  I connected with pt @ on 06/03/21 at  2:40 PM EST by a video enabled telemedicine application and verified that I am speaking with the correct person using two identifiers.   I discussed the limitations of evaluation and management by telemedicine and the availability of in person appointments. The patient expressed understanding and agreed to proceed.  I discussed the assessment and treatment plan with the patient. The patient was provided an opportunity to ask questions and all were answered. The patient agreed with the plan and demonstrated an understanding of the instructions.   The patient was advised to call back or seek an in-person evaluation if the symptoms worsen or if the condition fails to improve as anticipated.  I provided 25 minutes of non-face-to-face time during this encounter.  The patient was located at home.  The provider was located at [facility].   Dorothyann Gibbs, NP   Subjective:   Patient ID:  Autumn Greene is a 55 y.o. (DOB May 09, 1966) female.  Chief Complaint: No chief complaint on file.   HPI Autumn Greene presents for follow-up of MDD, GAD, SAD, insomnia, and Obsessional thoughts.   Describes mood today as "ok". Pleasant. Mood symptoms - reports some depression, anxiety, and irritability - "it fluctuates". Stating "I'm hanging in there". Mostly stays at home. Thinking about her sons - would like to see them over the holidays. Mostly staying home. Has not seen therapist recently. Improved interest and motivation. Taking medications as prescribed.  Energy levels "ok". Active, does not have a regular exercise routine.  Enjoys some usual interests and activities. Lives with husband, dog (not doing so well), brother in law. Spending time with family. Has 4 sons - 86, 30, 91, and 17. Painting. Appetite adequate. Weight loss 5 pounds - 160 from 165 pounds.   Sleeps better some nights than others. Sleeps 7 to 8 hours - waking up in pain.  Focus and concentration difficulties - "still all over the place". Completing tasks. Managing some aspects of household. 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 Denies SI or HI.  Denies AH or VH.   Review of Systems:  Review of Systems  Musculoskeletal:  Negative for gait problem.  Neurological:  Negative for tremors.  Psychiatric/Behavioral:         Please refer to HPI   Medications: I have reviewed the patient's current medications.  Current Outpatient Medications  Medication Sig Dispense Refill   amLODipine (NORVASC) 2.5 MG tablet Take 2.5 mg by mouth daily.     buPROPion (WELLBUTRIN XL) 150 MG 24 hr tablet Take 1 tablet (150 mg total) by mouth daily. 30 tablet 5   buPROPion (WELLBUTRIN XL) 300 MG 24 hr tablet Take 1 tablet (300 mg total) by mouth daily. 30 tablet 5   chlorthalidone (HYGROTON) 25 MG tablet Take 25 mg by mouth every morning.     docusate sodium (COLACE) 100 MG capsule Take 1 capsule (100 mg total) by mouth daily as needed. 30 capsule 2   DULoxetine (CYMBALTA) 60 MG capsule Take 1 capsule (60 mg total) by mouth daily. 30 capsule 5   gabapentin (NEURONTIN) 800 MG tablet Take 800 mg by mouth in the morning, at noon, in the evening, and at bedtime. Reports taking 1-4 times daily, depending on symptoms.     losartan (COZAAR) 50 MG tablet Take 50 mg by  mouth every evening.  0   Multiple Vitamin (MULTIVITAMIN WITH MINERALS) TABS tablet Take 1 tablet by mouth daily.     propranolol (INDERAL) 80 MG tablet Take 80 mg by mouth 2 (two) times daily.  2   rosuvastatin (CRESTOR) 5 MG tablet Take 5 mg by mouth 3 (three) times a week.     SUMAtriptan (IMITREX) 50 MG tablet Take 1 tablet (50 mg total) by mouth every 2 (two) hours as needed. May repeat in 2 hours if headache persists or recurs. 12 tablet 6   telmisartan (MICARDIS) 40 MG tablet Take  40 mg by mouth daily.     tiZANidine (ZANAFLEX) 4 MG tablet Take 4 mg by mouth daily.  2   zolpidem (AMBIEN) 10 MG tablet TAKE 1 & 1/4 (ONE & ONE-FOURTH) TABLETS BY MOUTH AT BEDTIME AS NEEDED FOR SLEEP 38 tablet 2   No current facility-administered medications for this visit.    Medication Side Effects: None  Allergies:  Allergies  Allergen Reactions   Lisinopril Anaphylaxis, Rash and Swelling    Other reaction(s): lip swelling   Sulfa Antibiotics Hives   Atorvastatin     Other reaction(s): myalgia   Chantix [Varenicline]     Other reaction(s): depression, headache   Rosanil Cleanser [Sulfacetamide Sodium-Sulfur]     Other reaction(s): hives   Sulfamethoxazole Rash    Past Medical History:  Diagnosis Date   Anxiety    Chiari malformation type I (HCC)    Coronary artery disease    Depression    Head pain    Hypertension    Tinnitus     Family History  Problem Relation Age of Onset   Hypertension Mother    Hyperlipidemia Mother    Heart disease Father     Social History   Socioeconomic History   Marital status: Legally Separated    Spouse name: Not on file   Number of children: 4   Years of education: college   Highest education level: Bachelor's degree (e.g., BA, AB, BS)  Occupational History   Occupation: Caregiver  Tobacco Use   Smoking status: Every Day    Packs/day: 0.25    Types: Cigarettes   Smokeless tobacco: Never  Vaping Use   Vaping Use: Every day  Substance and Sexual Activity   Alcohol use: Yes    Comment: occaional   Drug use: No   Sexual activity: Yes    Birth control/protection: Surgical  Other Topics Concern   Not on file  Social History Narrative   6 cups caffeine per day (Coke Zero).   Lives with significant other and his brother.   Right-handed.    Social Determinants of Health   Financial Resource Strain: Not on file  Food Insecurity: Not on file  Transportation Needs: Not on file  Physical Activity: Not on file  Stress:  Not on file  Social Connections: Not on file  Intimate Partner Violence: Not on file    Past Medical History, Surgical history, Social history, and Family history were reviewed and updated as appropriate.   Please see review of systems for further details on the patient's review from today.   Objective:   Physical Exam:  There were no vitals taken for this visit.  Physical Exam Constitutional:      General: She is not in acute distress. Musculoskeletal:        General: No deformity.  Neurological:     Mental Status: She is alert and oriented to person, place, and  time.     Coordination: Coordination normal.  Psychiatric:        Attention and Perception: Attention and perception normal. She does not perceive auditory or visual hallucinations.        Mood and Affect: Mood normal. Mood is not anxious or depressed. Affect is not labile, blunt, angry or inappropriate.        Speech: Speech normal.        Behavior: Behavior normal.        Thought Content: Thought content normal. Thought content is not paranoid or delusional. Thought content does not include homicidal or suicidal ideation. Thought content does not include homicidal or suicidal plan.        Cognition and Memory: Cognition and memory normal.        Judgment: Judgment normal.     Comments: Insight intact    Lab Review:     Component Value Date/Time   NA 135 05/08/2021 1409   K 3.7 05/08/2021 1409   CL 99 05/08/2021 1409   CO2 28 05/08/2021 1409   GLUCOSE 81 05/08/2021 1409   BUN 12 05/08/2021 1409   CREATININE 1.07 (H) 05/08/2021 1409   CALCIUM 9.7 05/08/2021 1409   GFRNONAA >60 05/08/2021 1409   GFRAA >60 07/01/2017 1710       Component Value Date/Time   WBC 6.2 07/01/2017 1710   RBC 4.10 07/01/2017 1710   HGB 13.3 07/01/2017 1710   HCT 40.5 07/01/2017 1710   PLT 212 07/01/2017 1710   MCV 98.8 07/01/2017 1710   MCH 32.4 07/01/2017 1710   MCHC 32.8 07/01/2017 1710   RDW 12.9 07/01/2017 1710    No  results found for: POCLITH, LITHIUM   No results found for: PHENYTOIN, PHENOBARB, VALPROATE, CBMZ   .res Assessment: Plan:    Plan:  PDMP reviewed  1. Cymbalta 60mg  daily 2. Wellbutrin XL 450mg  daily - denies seizure history 3. Ambien CR 12.5mg  at hs  Read and reviewed note with patient for accuracy.   RTC 6 months  Patient advised to contact office with any questions, adverse effects, or acute worsening in signs and symptoms. Diagnoses and all orders for this visit:  Insomnia, unspecified type -     zolpidem (AMBIEN) 10 MG tablet; TAKE 1 & 1/4 (ONE & ONE-FOURTH) TABLETS BY MOUTH AT BEDTIME AS NEEDED FOR SLEEP  Obsessional thoughts  Major depressive disorder, recurrent episode, moderate (HCC) -     buPROPion (WELLBUTRIN XL) 150 MG 24 hr tablet; Take 1 tablet (150 mg total) by mouth daily. -     buPROPion (WELLBUTRIN XL) 300 MG 24 hr tablet; Take 1 tablet (300 mg total) by mouth daily. -     DULoxetine (CYMBALTA) 60 MG capsule; Take 1 capsule (60 mg total) by mouth daily.  Generalized anxiety disorder -     DULoxetine (CYMBALTA) 60 MG capsule; Take 1 capsule (60 mg total) by mouth daily.  Social anxiety disorder    Please see After Visit Summary for patient specific instructions.  No future appointments.   No orders of the defined types were placed in this encounter.     -------------------------------

## 2021-06-27 ENCOUNTER — Other Ambulatory Visit: Payer: Self-pay | Admitting: Adult Health

## 2021-06-27 DIAGNOSIS — G4700 Insomnia, unspecified: Secondary | ICD-10-CM

## 2021-07-01 ENCOUNTER — Other Ambulatory Visit: Payer: Self-pay | Admitting: Adult Health

## 2021-07-01 DIAGNOSIS — G4700 Insomnia, unspecified: Secondary | ICD-10-CM

## 2021-07-03 ENCOUNTER — Other Ambulatory Visit: Payer: Self-pay

## 2021-07-03 DIAGNOSIS — G4700 Insomnia, unspecified: Secondary | ICD-10-CM

## 2021-07-03 MED ORDER — ZOLPIDEM TARTRATE 10 MG PO TABS: ORAL_TABLET | ORAL | 2 refills | Status: DC

## 2021-08-10 ENCOUNTER — Other Ambulatory Visit: Payer: Self-pay | Admitting: Physician Assistant

## 2021-08-10 ENCOUNTER — Ambulatory Visit
Admission: RE | Admit: 2021-08-10 | Discharge: 2021-08-10 | Disposition: A | Discharge status: Home / Self Care | Payer: 59 | Source: Ambulatory Visit | Attending: Physician Assistant | Admitting: Physician Assistant

## 2021-08-10 DIAGNOSIS — R051 Acute cough: Secondary | ICD-10-CM

## 2021-08-10 DIAGNOSIS — F17200 Nicotine dependence, unspecified, uncomplicated: Secondary | ICD-10-CM

## 2021-08-10 DIAGNOSIS — H66003 Acute suppurative otitis media without spontaneous rupture of ear drum, bilateral: Secondary | ICD-10-CM

## 2021-10-01 ENCOUNTER — Other Ambulatory Visit: Payer: Self-pay | Admitting: Adult Health

## 2021-10-01 DIAGNOSIS — G4700 Insomnia, unspecified: Secondary | ICD-10-CM

## 2021-11-10 ENCOUNTER — Ambulatory Visit (INDEPENDENT_AMBULATORY_CARE_PROVIDER_SITE_OTHER): Payer: 59 | Admitting: Orthopaedic Surgery

## 2021-11-10 ENCOUNTER — Ambulatory Visit (INDEPENDENT_AMBULATORY_CARE_PROVIDER_SITE_OTHER): Payer: 59

## 2021-11-10 DIAGNOSIS — M5442 Lumbago with sciatica, left side: Secondary | ICD-10-CM

## 2021-11-10 DIAGNOSIS — M1612 Unilateral primary osteoarthritis, left hip: Secondary | ICD-10-CM

## 2021-11-10 DIAGNOSIS — G8929 Other chronic pain: Secondary | ICD-10-CM

## 2021-11-10 MED ORDER — PREDNISONE 10 MG (21) PO TBPK: ORAL_TABLET | ORAL | 0 refills | Status: AC

## 2021-11-10 MED ORDER — METHOCARBAMOL 500 MG PO TABS: 500.0000 mg | ORAL_TABLET | Freq: Two times a day (BID) | ORAL | 0 refills | Status: AC | PRN

## 2021-11-10 NOTE — Progress Notes (Signed)
? ?Office Visit Note ?  ?Patient: Autumn Greene           ?Date of Birth: 10/29/1965           ?MRN: [id number] ?Visit Date: 11/10/2021 ?             ?Requested by: Johna Roles, PA ?[address] ?[address] ?[place],  [place] [address] ?PCP: Johna Roles, PA ? ? ?Assessment & Plan: ?Visit Diagnoses:  ?1. Chronic left-sided low back pain with left-sided sciatica   ? ? ?Plan: Impression is chronic left hip pain likely referred from the lumbar spine.  I would like to refer the patient to outpatient physical therapy for this.  I would also like to start her on a steroid taper muscle relaxer.  If her symptoms do not improve, she will call us and let us know and at that point we will likely order an MRI of the lumbar spine.  Otherwise, follow-up with [place] as needed. ? ?Follow-Up Instructions: Return if symptoms worsen or fail to improve.  ? ?Orders:  ?Orders Placed This Encounter  ?Procedures  ? XR HIP UNILAT W OR W/O PELVIS 1V LEFT  ? XR Lumbar Spine 2-3 Views  ? ?Meds ordered this encounter  ?Medications  ? predniSONE (STERAPRED UNI-PAK 21 TAB) 10 MG (21) TBPK tablet  ?  Sig: Take as directed  ?  Dispense:  21 tablet  ?  Refill:  0  ? methocarbamol (ROBAXIN) 500 MG tablet  ?  Sig: Take 1 tablet (500 mg total) by mouth 2 (two) times daily as needed.  ?  Dispense:  20 tablet  ?  Refill:  0  ? ? ? ? Procedures: ?No procedures performed ? ? ?Clinical Data: ?No additional findings. ? ? ?Subjective: ?Chief Complaint  ?Patient presents with  ? Left Hip - Pain  ? ? ?HPI patient is a pleasant 56 year old female who comes in today with left hip pain for the past 3 months.  She denies any injury but does note she has an 35-month-old granddaughter who she has been spending a lot of time with and is helping take care of.  The pain she has is primarily to the left buttock and radiates down the back of the leg.  She does note occasional pain to the groin as well.  She has numbness to the anterior thigh which radiates down  her leg to the first and second toe.  Symptoms appear to be worse when she is standing still.  She also has slight pain with stair climbing as well as when she stands after lying in bed all night.  She has been taking gabapentin which does not seem to help.  She does note previous lumbar surgery by a surgeon in [facility] back in 2018.  She is unsure of the surgeon or what she had done. ? ?Review of Systems as detailed in HPI.  All others reviewed and are negative. ? ? ?Objective: ?Vital Signs: There were no vitals taken for this visit. ? ?Physical Exam well-nourished female no acute distress.  Alert and oriented x3. ? ?Ortho Exam left hip exam: Painless logroll and FADIR.  Lumbar spine exam shows mild lower lumbar spinous and paraspinous tenderness.  Positive straight leg raise.  No focal weakness.  She is neurovascular intact distally. ? ?Specialty Comments:  ?No specialty comments available. ? ?Imaging: ?No results found. ? ? ?PMFS History: ?Patient Active Problem List  ? Diagnosis Date Noted  ? Chronic  nonintractable headache 07/01/2020  ? Neck pain 07/01/2020  ? ?Past Medical History:  ?Diagnosis Date  ? Anxiety   ? Chiari malformation type I ([place])   ? Coronary artery disease   ? Depression   ? Head pain   ? Hypertension   ? Tinnitus   ?  ?Family History  ?Problem Relation Age of Onset  ? Hypertension Mother   ? Hyperlipidemia Mother   ? Heart disease Father   ?  ?Past Surgical History:  ?Procedure Laterality Date  ? ABDOMINAL HYSTERECTOMY    ? BACK SURGERY    ? CARPAL TUNNEL RELEASE Bilateral   ? chiari malformation    ? FINGER ARTHROSCOPY WITH CARPOMETACARPEL [facility]) ARTHROPLASTY Left 05/11/2021  ? Procedure: Left thumb CMC arthroplasty with tendon transfer;  Surgeon: Iran Planas, MD;  Location: [place];  Service: Orthopedics;  Laterality: Left;  with IV sedation  ? STERIOD INJECTION Right 05/11/2021  ? Procedure: RIGHT THUMB CARPOMETACARPAL STEROID JOINT INJECTION;  Surgeon: Iran Planas,  MD;  Location: [place];  Service: Orthopedics;  Laterality: Right;  ? thumb surgery Left   ? TONSILLECTOMY    ? ?Social History  ? ?Occupational History  ? Occupation: Caregiver  ?Tobacco Use  ? Smoking status: Every Day  ?  Packs/day: 0.25  ?  Types: Cigarettes  ? Smokeless tobacco: Never  ?Vaping Use  ? Vaping Use: Every day  ?Substance and Sexual Activity  ? Alcohol use: Yes  ?  Comment: occaional  ? Drug use: No  ? Sexual activity: Yes  ?  Birth control/protection: Surgical  ? ? ? ? ? ? ?

## 2021-11-16 ENCOUNTER — Ambulatory Visit: Payer: 59 | Admitting: Psychiatry

## 2021-11-17 ENCOUNTER — Ambulatory Visit: Payer: 59 | Admitting: Adult Health

## 2021-12-10 ENCOUNTER — Encounter: Payer: Self-pay | Admitting: Adult Health

## 2021-12-10 ENCOUNTER — Ambulatory Visit (INDEPENDENT_AMBULATORY_CARE_PROVIDER_SITE_OTHER): Payer: 59 | Admitting: Psychiatry

## 2021-12-10 ENCOUNTER — Ambulatory Visit (INDEPENDENT_AMBULATORY_CARE_PROVIDER_SITE_OTHER): Payer: 59 | Admitting: Adult Health

## 2021-12-10 DIAGNOSIS — F4010 Social phobia, unspecified: Secondary | ICD-10-CM

## 2021-12-10 DIAGNOSIS — F411 Generalized anxiety disorder: Secondary | ICD-10-CM

## 2021-12-10 DIAGNOSIS — G4700 Insomnia, unspecified: Secondary | ICD-10-CM

## 2021-12-10 DIAGNOSIS — F331 Major depressive disorder, recurrent, moderate: Secondary | ICD-10-CM | POA: Diagnosis not present

## 2021-12-10 DIAGNOSIS — Z789 Other specified health status: Secondary | ICD-10-CM

## 2021-12-10 MED ORDER — ZOLPIDEM TARTRATE 10 MG PO TABS: ORAL_TABLET | ORAL | 1 refills | Status: DC

## 2021-12-10 MED ORDER — NALTREXONE HCL 50 MG PO TABS: 50.0000 mg | ORAL_TABLET | Freq: Every day | ORAL | 2 refills | Status: DC

## 2021-12-10 MED ORDER — BUPROPION HCL ER (XL) 150 MG PO TB24: 150.0000 mg | ORAL_TABLET | Freq: Every day | ORAL | 5 refills | Status: AC

## 2021-12-10 MED ORDER — BUPROPION HCL ER (XL) 300 MG PO TB24: 300.0000 mg | ORAL_TABLET | Freq: Every day | ORAL | 5 refills | Status: AC

## 2021-12-10 MED ORDER — DULOXETINE HCL 60 MG PO CPEP: 60.0000 mg | ORAL_CAPSULE | Freq: Every day | ORAL | 5 refills | Status: AC

## 2021-12-10 NOTE — Progress Notes (Signed)
[name] [id number] Sep 18, 1965 56 y.o.  Subjective:   Patient ID:  Autumn Greene is a 56 y.o. (DOB 01/02/1966) female.  Chief Complaint: No chief complaint on file.   HPI Autumn Greene presents to the office today for follow-up of MDD, GAD, SAD, insomnia, alcohol abuse, and obsessional thoughts.   Describes mood today as "ok". Pleasant. Mood symptoms - reports some depression, anxiety, and irritability. Denies panic attacks. Reports mood fluctuations. Stating "it's been tough lately". She and husband having issues. Reports increased alcohol use - drinking 6 beers a night. Stating "I don't want to keep doing that". Mostly staying home. Has not seen therapist recently, but has an upcoming appointment. Decreased interest and motivation. Taking medications as prescribed.  Energy levels lower. Active, does not have a regular exercise routine.  Enjoys some usual interests and activities. Lives with husband, dog (not doing so well), brother in law. Spending time with family. Has 4 sons - 41, 90, 30, and 23. Painting. Appetite adequate. Weight gain - 162 to 182 pounds.  Sleeps better some nights than others. Sleeps 6 hours Focus and concentration difficulties. Completing tasks. Managing some aspects of household. 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 Denies SI or HI.  Denies AH or VH.   Flowsheet Row Admission (Discharged) from 05/11/2021 in [place] No Risk        Review of Systems:  Review of Systems  Musculoskeletal:  Negative for gait problem.  Neurological:  Negative for tremors.  Psychiatric/Behavioral:         Please refer to HPI   Medications: I have reviewed the patient's current medications.  Current Outpatient Medications  Medication Sig Dispense Refill   naltrexone (DEPADE) 50 MG tablet Take 1 tablet (50 mg total) by mouth daily. 30 tablet 2   amLODipine (NORVASC) 2.5 MG  tablet Take 2.5 mg by mouth daily.     buPROPion (WELLBUTRIN XL) 150 MG 24 hr tablet Take 1 tablet (150 mg total) by mouth daily. 30 tablet 5   buPROPion (WELLBUTRIN XL) 300 MG 24 hr tablet Take 1 tablet (300 mg total) by mouth daily. 30 tablet 5   chlorthalidone (HYGROTON) 25 MG tablet Take 25 mg by mouth every morning.     docusate sodium (COLACE) 100 MG capsule Take 1 capsule (100 mg total) by mouth daily as needed. 30 capsule 2   DULoxetine (CYMBALTA) 60 MG capsule Take 1 capsule (60 mg total) by mouth daily. 30 capsule 5   gabapentin (NEURONTIN) 800 MG tablet Take 800 mg by mouth in the morning, at noon, in the evening, and at bedtime. Reports taking 1-4 times daily, depending on symptoms.     losartan (COZAAR) 50 MG tablet Take 50 mg by mouth every evening.  0   methocarbamol (ROBAXIN) 500 MG tablet Take 1 tablet (500 mg total) by mouth 2 (two) times daily as needed. 20 tablet 0   Multiple Vitamin (MULTIVITAMIN WITH MINERALS) TABS tablet Take 1 tablet by mouth daily.     predniSONE (STERAPRED UNI-PAK 21 TAB) 10 MG (21) TBPK tablet Take as directed 21 tablet 0   propranolol (INDERAL) 80 MG tablet Take 80 mg by mouth 2 (two) times daily.  2   rosuvastatin (CRESTOR) 5 MG tablet Take 5 mg by mouth 3 (three) times a week.     SUMAtriptan (IMITREX) 50 MG tablet Take 1 tablet (50 mg total) by mouth every 2 (  two) hours as needed. May repeat in 2 hours if headache persists or recurs. 12 tablet 6   telmisartan (MICARDIS) 40 MG tablet Take 40 mg by mouth daily.     tiZANidine (ZANAFLEX) 4 MG tablet Take 4 mg by mouth daily.  2   zolpidem (AMBIEN) 10 MG tablet TAKE 1 AND 1/4 TABLETS BY MOUTH AT BEDTIME AS NEEDED FOR SLEEP 38 tablet 1   No current facility-administered medications for this visit.    Medication Side Effects: None  Allergies:  Allergies  Allergen Reactions   Lisinopril Anaphylaxis, Rash and Swelling    Other reaction(s): lip swelling   Sulfa Antibiotics Hives   Atorvastatin      Other reaction(s): myalgia   Chantix [Varenicline]     Other reaction(s): depression, headache   Rosanil Cleanser [Sulfacetamide Sodium-Sulfur]     Other reaction(s): hives   Sulfamethoxazole Rash    Past Medical History:  Diagnosis Date   Anxiety    Chiari malformation type I (HCC)    Coronary artery disease    Depression    Head pain    Hypertension    Tinnitus     Past Medical History, Surgical history, Social history, and Family history were reviewed and updated as appropriate.   Please see review of systems for further details on the patient's review from today.   Objective:   Physical Exam:  There were no vitals taken for this visit.  Physical Exam Constitutional:      General: She is not in acute distress. Musculoskeletal:        General: No deformity.  Neurological:     Mental Status: She is alert and oriented to person, place, and time.     Coordination: Coordination normal.  Psychiatric:        Attention and Perception: Attention and perception normal. She does not perceive auditory or visual hallucinations.        Mood and Affect: Mood normal. Mood is not anxious or depressed. Affect is not labile, blunt, angry or inappropriate.        Speech: Speech normal.        Behavior: Behavior normal.        Thought Content: Thought content normal. Thought content is not paranoid or delusional. Thought content does not include homicidal or suicidal ideation. Thought content does not include homicidal or suicidal plan.        Cognition and Memory: Cognition and memory normal.        Judgment: Judgment normal.     Comments: Insight intact    Lab Review:     Component Value Date/Time   NA 135 05/08/2021 1409   K 3.7 05/08/2021 1409   CL 99 05/08/2021 1409   CO2 28 05/08/2021 1409   GLUCOSE 81 05/08/2021 1409   BUN 12 05/08/2021 1409   CREATININE 1.07 (H) 05/08/2021 1409   CALCIUM 9.7 05/08/2021 1409   GFRNONAA >60 05/08/2021 1409   GFRAA >60 07/01/2017 1710        Component Value Date/Time   WBC 6.2 07/01/2017 1710   RBC 4.10 07/01/2017 1710   HGB 13.3 07/01/2017 1710   HCT 40.5 07/01/2017 1710   PLT 212 07/01/2017 1710   MCV 98.8 07/01/2017 1710   MCH 32.4 07/01/2017 1710   MCHC 32.8 07/01/2017 1710   RDW 12.9 07/01/2017 1710    No results found for: POCLITH, LITHIUM   No results found for: PHENYTOIN, PHENOBARB, VALPROATE, CBMZ   .res Assessment: Plan:  Plan:  PDMP reviewed  1. Cymbalta 60mg  daily 2. Wellbutrin XL 450mg  daily - denies seizure history 3. Ambien 10mg  at hs 4. Add Naltrexone 50mg  daily   RTC 6 months  Patient advised to contact office with any questions, adverse effects, or acute worsening in signs and symptoms.  Diagnoses and all orders for this visit:  Social anxiety disorder  Major depressive disorder, recurrent episode, moderate (HCC) -     DULoxetine (CYMBALTA) 60 MG capsule; Take 1 capsule (60 mg total) by mouth daily. -     buPROPion (WELLBUTRIN XL) 150 MG 24 hr tablet; Take 1 tablet (150 mg total) by mouth daily. -     buPROPion (WELLBUTRIN XL) 300 MG 24 hr tablet; Take 1 tablet (300 mg total) by mouth daily.  Generalized anxiety disorder -     DULoxetine (CYMBALTA) 60 MG capsule; Take 1 capsule (60 mg total) by mouth daily.  Insomnia, unspecified type -     zolpidem (AMBIEN) 10 MG tablet; TAKE 1 AND 1/4 TABLETS BY MOUTH AT BEDTIME AS NEEDED FOR SLEEP  Alcohol use -     naltrexone (DEPADE) 50 MG tablet; Take 1 tablet (50 mg total) by mouth daily.     Please see After Visit Summary for patient specific instructions.  Future Appointments  Date Time Provider [place]  12/10/2021  1:00 PM Shanon Ace, LCSW [facility] None  03/12/2022 12:00 PM Yazen Rosko, Berdie Ogren, NP [facility] None    No orders of the defined types were placed in this encounter.   -------------------------------
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
## 2021-12-31 DIAGNOSIS — I361 Nonrheumatic tricuspid (valve) insufficiency: Secondary | ICD-10-CM | POA: Diagnosis not present

## 2022-01-05 ENCOUNTER — Ambulatory Visit (INDEPENDENT_AMBULATORY_CARE_PROVIDER_SITE_OTHER): Payer: 59 | Admitting: Psychiatry

## 2022-01-05 DIAGNOSIS — F331 Major depressive disorder, recurrent, moderate: Secondary | ICD-10-CM | POA: Diagnosis not present

## 2022-01-05 NOTE — Progress Notes (Signed)
[facility] Counselor/Therapist Progress Note  Patient ID: Autumn Greene, MRN: [id number],    Date: 01/05/2022  Time Spent: 55  minutes   Treatment Type: Individual Therapy  Reported Symptoms: anxiety, social anxiety, depression, anger, hopelessness (no SI)                                                                                Mental Status Exam:  Appearance:   Casual     Behavior:  Appropriate, Sharing, and Motivated  Motor:  Normal  Speech/Language:   Clear and Coherent  Affect:  Depressed and anxious  Mood:  anxious and depressed  Thought process:  goal directed  Thought content:    overthinking  Sensory/Perceptual disturbances:    WNL  Orientation:  oriented to person, place, time/date, situation, day of week, month of year, year, and stated date of January 05, 2022  Attention:  Good  Concentration:  Fair  Memory:  WNL  Fund of knowledge:   Fair  Insight:    Fair  Judgment:   Fair and Poor  Impulse Control:  Fair and Poor   Risk Assessment: Danger to Self:  No Self-injurious Behavior: No Danger to Others: No Duty to Warn:no Physical Aggression / Violence:No  Access to Firearms a concern: No  Gang Involvement:No   Subjective: Patient today sharing how chaotic things have been in their home recently, with some inappropriate and unhealthy behaviors. Patient processed her situation more in depth today looking at more specific boundaries needed within the family.  Patient was very upset today and talking through several challenges she is having with several different situations some involving family.  (Not all details included in this note due to patient privacy needs). Encouraged patient to think about what she really needs to do for her own emotional wellbeing and we will follow up again next session.  Interventions: Solution-Oriented/Positive Psychology, Ego-Supportive, and Insight-Oriented  Treatment goal plan of care: Patient not signing treatment  goal plan on computer screen due to COVID and her medical issues. Treatment goal plan: Treatment goals remain on treatment plan as patient works with strategies to achieve her goals.  Progress is assessed each session and is documented in the "subject" and/or "note/progress" section of treatment note. Long-term goal: Reduce overall level, frequency, and intensity of the anxiety so that daily functioning is not impaired. Short-term goal: Verbalize an understanding of the role that fearful thinking plays in creating fears, excessive worry, and persistent anxiety symptoms. Strategies: Identify, challenge, and replace fearful self-talk with positive, realistic, and empowering self-talk.  Diagnosis:   ICD-10-CM   1. Major depressive disorder, recurrent episode, moderate (HCC)  F33.1      Plan:  Patient showing good participation and some motivation as she worked on her current distress within the family and home.  Very upset today as she explained her situation and what her options may be, but acknowledged how tough it is to make decisions for herself.  Needed most of the session today to process the circumstances of her living situation and some of her family relationships, and expressed how she did feel better by the end of session just being able to talk  it through and get some ideas as to how to better handle some situations and her thoughts about what might be best for her in the future.  Continues to struggle with some impulsivity and a lack of seeing strength within herself, however, she did show some strength today as she had to encourage to be even more open than previously and her sharing of information.  Encouraged her to do some journaling in between sessions if she would like and bring into next session. Also encouraged patient in practicing more positive behaviors as discussed in session including: Reducing her impulsivity, making better decisions, believing more in herself, stop assuming  negatives, be in touch with people who are supportive, look for some positives in each day, challenge and counteract her self doubt, reduce overthinking and over analyzing, healthy nutrition and exercise, positive self talk, being more intentional and listening to others to really hear what they are saying before she answers, saying no when she needs to say no, letting go of guilt from the past that can hold her back now, and recognize the strength she can show when she works with goal-directed behaviors to move in a direction that supports her improved emotional health.  Goal review and progress/challenges noted with patient.  Next appointment within 3 weeks.  This record has been created using [employer].  Chart creation errors have been sought, but may not always have been located and corrected.  Such creation errors do not reflect on the standard of medical care provided.   Mathis Fare, LCSW

## 2022-01-06 ENCOUNTER — Encounter (INDEPENDENT_AMBULATORY_CARE_PROVIDER_SITE_OTHER): Payer: Self-pay

## 2022-01-20 ENCOUNTER — Ambulatory Visit (INDEPENDENT_AMBULATORY_CARE_PROVIDER_SITE_OTHER): Payer: 59 | Admitting: Psychiatry

## 2022-01-20 DIAGNOSIS — F331 Major depressive disorder, recurrent, moderate: Secondary | ICD-10-CM | POA: Diagnosis not present

## 2022-01-20 NOTE — Progress Notes (Signed)
[facility] Counselor/Therapist Progress Note  Patient ID: Autumn Greene, MRN: [id number],    Date: 01/20/2022  Time Spent: 58 minutes   Virtual Visit via Telehealth Note:  [place] with patient by a telemedicine/telehealth application, with their informed consent, and verified patient privacy and that I am speaking with the correct person using two identifiers. I discussed the limitations, risks, security and privacy concerns of performing psychotherapy and the availability of in person appointments. I also discussed with the patient that there may be a patient responsible charge related to this service. The patient expressed understanding and agreed to proceed. I discussed the treatment planning with the patient. The patient was provided an opportunity to ask questions and all were answered. The patient agreed with the plan and demonstrated an understanding of the instructions. The patient was advised to call  our office if  symptoms worsen or feel they are in a crisis state and need immediate contact.   Therapist Location: office Patient Location: home   Treatment Type: Individual Therapy  Reported Symptoms: depression, anxiety, feeling "not good enough" at times  Mental Status Exam:  Appearance:   Casual     Behavior:  Appropriate, Sharing, and Motivated  Motor:  Normal  Speech/Language:   Clear and Coherent  Affect:  Depressed and anxious  Mood:  anxious and depressed  Thought process:  goal directed  Thought content:    Obsessive thoughts about not feeling good enough  Sensory/Perceptual disturbances:    WNL  Orientation:  oriented to person, place, time/date, situation, day of week, month of year, year, and stated date of January 20, 2022  Attention:  Fair  Concentration:  Fair  Memory:  Some short term memory issues "sometimes long term issues as well  Fund of knowledge:   Good  Insight:    Fair  Judgment:   Fair  Impulse Control:  Fair    Risk Assessment: Danger to Self:  No Self-injurious Behavior: No Danger to Others: No Duty to Warn:no Physical Aggression / Violence:No  Access to Firearms a concern: No  Gang Involvement:No   Subjective:  Patient in today reporting anxiety and depression, and "not feeling good enough and this is getting worse."  States that this feeling is tied to marital relationship and the relationship with her 78 yr old son and has been "going on about past 2 yrs." Wanted session today to share and process this situation with son and with husband. Heavy use of alcohol in the home and patient trying to quit drinking and have some influence over chaos and unpredictability "but this is really hard", ans explains more of what she means. Trying to look as "to who and what I can control".  Patient expresses what she feels are next steps for her including: go to bed earlier because "I get frustrated and will drink to calm down" and self-soothe, use writing as a tool, wants to get AA sponsor but the [place] meeting she can't do "due to my social anxiety."  Talked through her concerns about this and also some strategies she can go ahead and begin in working with goal-directed behaviors.  She shared that there is an [place] meeting that meets up at the [employer] building close to where they live and she plans to speak with them and ask if it is possible to get a sponsor even if she cannot attend the meetings just yet because of her social anxiety.  Patient seemed appreciative of  session today and was able to get some support and real encouragement to follow through on her trying to stop her drinking and other behaviors that we discussed that are keeping her from being healthier and happier in her current situation.  Continues to be significant family issues, some of which patient is more involved in than others, and some revolves around more extended family.  (Not all details included in this note due to patient privacy needs.)   Focusing on healthier boundaries and more positive self-care for her own emotional and physical wellbeing.   Interventions: Cognitive Behavioral Therapy and Motivational Interviewing  Treatment goal plan: Treatment goals remain on treatment plan as patient works with strategies to achieve her goals.  Progress is assessed each session and is documented in the "subject" and/or "note/progress" section of treatment note. Long-term goal: Reduce overall level, frequency, and intensity of the anxiety so that daily functioning is not impaired. Short-term goal: Verbalize an understanding of the role that fearful thinking plays in creating fears, excessive worry, and persistent anxiety symptoms. Strategies: Identify, challenge, and replace fearful self-talk with positive, realistic, and empowering self-talk.   Diagnosis:   ICD-10-CM   1. Major depressive disorder, recurrent episode, moderate (HCC)  F33.1      Plan:  Patient in today showing good motivation and participation as she focused on her need to stop drinking, and did agree to contact a facility nearby her home where they have AA meetings.  Does not feel she can attend right now because of her social anxiety but is wanting to asked them if they could let her connect with a sponsor to at least start the process of trying to get sober.  Agreed to work more on her social anxiety here in sessions and hopefully work up to the point of being able to attend [employer]  Knows that it would be hard at home because of husband's excessive drinking but does not expect him to want to stop at this point.  Discussed the challenges for her but also encouraged her to follow through as she recognizes her life is not going to go in a positive direction if she continues to drink.  Also recognizes that she needs to take responsibility for this in order for it to happen. She presents today as being much more motivated for this.  Did seem to benefit from being able to  talk more openly at length today.  Discussed some communication and boundary setting issues and she has these available to her to try out between sessions.  Encouraged to follow through on the other goal directed behaviors discussed in session and we will review next session.  Encouraged journaling between sessions. Encouraged patient in her practice of intentionally choosing more positive behaviors as discussed in session including: Making better decisions, believing more in herself to make positive changes, stop assuming negatives, reducing her impulsivity, be in touch with people who are supportive, look for positives in each day, challenge and counteract her self doubt, reduce overthinking and over analyzing, healthy nutrition and exercise, positive self talk, being more intentional and listening to others to really hear what they are saying before she answers, saying no when she needs to say no, letting go of guilt from the past that can hold her back now, consider decisions she may currently need to make for herself to be healthier and mentally and emotionally, and realize the strength she shows working with goal directed behaviors in a direction that supports her improved  emotional health and overall wellbeing  Goal review and progress/challenges noted with patient.  Next appointment within 3 weeks.  This record has been created using [employer].  Chart creation errors have been sought, but may not always have been located and corrected.  Such creation errors do not reflect on the standard of medical care provided.   Mathis Fare, LCSW

## 2022-02-01 ENCOUNTER — Ambulatory Visit (INDEPENDENT_AMBULATORY_CARE_PROVIDER_SITE_OTHER): Payer: 59 | Admitting: Psychiatry

## 2022-02-01 DIAGNOSIS — F411 Generalized anxiety disorder: Secondary | ICD-10-CM

## 2022-02-01 NOTE — Progress Notes (Signed)
[facility] Counselor/Therapist Progress Note  Patient ID: Autumn Greene, MRN: [id number],    Date: 02/01/2022  Time Spent: 55 minutes   Treatment Type: Individual Therapy  Reported Symptoms: anxiety "currently my stronger symptom", depression, trying to decrease her alcohol intake (currently "maybe every other day 3-4 beers)  Mental Status Exam:  Appearance:   Casual     Behavior:  Appropriate, Sharing, and Motivated  Motor:  Normal  Speech/Language:   Clear and Coherent  Affect:  Anxious, depressed  Mood:  anxious and depressed  Thought process:  goal directed  Thought content:    Some ruminating  Sensory/Perceptual disturbances:    WNL  Orientation:  oriented to person, place, time/date, situation, day of week, month of year, year, and stated date of February 01, 2022  Attention:  Good  Concentration:  Fair  Memory:  WNL  Fund of knowledge:   Good  Insight:    Good and Fair  Judgment:   Good and Fair  Impulse Control:  Good and Fair   Risk Assessment: Danger to Self:  No Self-injurious Behavior: No Danger to Others: No Duty to Warn:no Physical Aggression / Violence:No  Access to Firearms a concern: No  Gang Involvement:No   Subjective: Patient in today reporting anxiety as her main symptom currently, along with depression, "not feeling good enough" which she explains relates to her history within family and other relationships. Hurt by family "still hurts me now, which she discussed more in session today, using specific examples. Wants to stop drinking, "can't do AA due to discomfort in groups and has history of difficult experiences in groups.  Plans to check with AA re: can she have a sponsor without attending meetings right now, due to her intense anxiety in groups. Processed more family stressors today and how she doesn't set good boundaries with other family "because I need to be loved." Looks at what I can do or control versus what I can't. Didn't go to bed  earlier as planned but did refrain from drinking on recent nights. Hasn't followed up yet with AA at [facility] but states she will before next visit.  Continues to be significantly impacted by family issues with some family members being very rigid about contact, but there also seems to be a strong history of family dysfunction behind all this.  Patient reports that she plans to continue trying to have healthier boundaries and more positive self-care  Interventions: Cognitive Behavioral Therapy  Treatment goal plan: Treatment goals remain on treatment plan as patient works with strategies to achieve her goals.  Progress is assessed each session and is documented in the "subject" and/or "note/progress" section of treatment note. Long-term goal: Reduce overall level, frequency, and intensity of the anxiety so that daily functioning is not impaired. Short-term goal: Verbalize an understanding of the role that fearful thinking plays in creating fears, excessive worry, and persistent anxiety symptoms. Strategies: Identify, challenge, and replace fearful self-talk with positive, realistic, and empowering self-talk.   Diagnosis:   ICD-10-CM   1. Generalized anxiety disorder  F41.1      Plan:  Patient in today reporting her main symptom as being anxiety and also still having some depression especially around the issues of "not feeling good enough", and is very affected by family dysfunction and broken relationship, as noted above.  To focus more on herself and the changes she individually is wanting to make and especially in working to decrease her alcohol consumption.  Continues  to say she cannot go to AA meetings due to her excessive anxiety but is to follow through and ask someone at [facility] if she is able to get a sponsor but not attend meetings yet.  States that she has not done this yet and I have urged her today to follow through on this as she feels also it could help her.  We  continue to focus on her social anxiety in sessions and also her process of trying to get sober.  Does seem to realize that her alcohol consumption seems to keep her enclosed in her current turmoil of unhealthy relationships and things that hold her back when she states that she wants to move forward.  Emphasized with patient the need to get her alcohol under control based on her report of usage and how it impacts her.  To continue with setting boundaries especially within the family, in addition to other goal directed behaviors as discussed in session. Encouraged patient in her practice of more positive behaviors as discussed in session including: Making better decisions, intentionally believing more in herself to make positive changes, stop assuming negatives, reduce her impulsivity, be in touch with people who are supportive, look for positives daily, challenge and counteract her self doubt, reduce overthinking and over analyzing, healthy nutrition and exercise, positive self talk, being more intentional and listening to others to really hear what they are saying before she answers, saying no when she needs to say no, letting go of guilt from the past that can hold her back now, consider decisions she may currently need to make for herself to be healthier mentally and emotionally, and recognize the strength she shows working with goal directed behaviors to move in a direction that supports her improved emotional health.  Goal review and progress/challenges noted with patient.  Next appointment within 2 to 3 weeks.  This record has been created using [employer].  Chart creation errors have been sought, but may not always have been located and corrected.  Such creation errors do not reflect on the standard of medical care provided.   Mathis Fare, LCSW

## 2022-02-16 ENCOUNTER — Ambulatory Visit (INDEPENDENT_AMBULATORY_CARE_PROVIDER_SITE_OTHER): Payer: 59 | Admitting: Psychiatry

## 2022-02-16 ENCOUNTER — Ambulatory Visit (INDEPENDENT_AMBULATORY_CARE_PROVIDER_SITE_OTHER): Payer: 59 | Admitting: Adult Health

## 2022-02-16 ENCOUNTER — Encounter: Payer: Self-pay | Admitting: Adult Health

## 2022-02-16 DIAGNOSIS — F331 Major depressive disorder, recurrent, moderate: Secondary | ICD-10-CM

## 2022-02-16 DIAGNOSIS — G4700 Insomnia, unspecified: Secondary | ICD-10-CM

## 2022-02-16 DIAGNOSIS — F4010 Social phobia, unspecified: Secondary | ICD-10-CM

## 2022-02-16 DIAGNOSIS — F1010 Alcohol abuse, uncomplicated: Secondary | ICD-10-CM

## 2022-02-16 DIAGNOSIS — F428 Other obsessive-compulsive disorder: Secondary | ICD-10-CM

## 2022-02-16 DIAGNOSIS — F411 Generalized anxiety disorder: Secondary | ICD-10-CM

## 2022-02-16 NOTE — Progress Notes (Signed)
Autumn Greene [id number] 06-19-1966 56 y.o.  Subjective:   Patient ID:  Autumn Greene is a 56 y.o. (DOB 01-20-1966) female.  Chief Complaint: No chief complaint on file.   HPI Autumn Greene presents to the office today for follow-up of MDD, GAD, SAD, insomnia, alcohol abuse, and obsessional thoughts.   Describes mood today as "ok". Pleasant. Mood symptoms - reports some depression, anxiety, and irritability. Denies panic attacks. Reports mood fluctuations. Stating "I'm still struggling". Feels like her environment is what makes her drink. Recently tried the Naltrexone and it did not help her. She and husband having ongoing marital issues. Husband drinking a 12 pack of beer a night - multiple health issues. Lost family dog. Mostly staying home - afraid to leave the house - "makes me sick". Saw Rockne Menghini today for therapy. Decreased interest and motivation. Taking medications as prescribed. Energy levels lower. Active, does not have a regular exercise routine.  Enjoys some usual interests and activities. Lives with husband, brother in law. Spending time with family. Has 4 sons - 27, 30, 77, and 64. Painting. Appetite adequate. Weight gain - 182 to 184 pounds.  Sleeps better some nights than others. Sleeps 6 hours Focus and concentration difficulties. Completing tasks. Managing some aspects of household. 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 Denies SI or HI.  Denies AH or VH. Denies self harm.  Consumes alcohol every night - 4 beers.   Flowsheet Row Admission (Discharged) from 05/11/2021 in [facility]  C-[facility] RISK CATEGORY No Risk        Review of Systems:  Review of Systems  Musculoskeletal:  Negative for gait problem.  Neurological:  Negative for tremors.  Psychiatric/Behavioral:         Please refer to HPI    Medications: I have reviewed the patient's current medications.  Current Outpatient  Medications  Medication Sig Dispense Refill   amLODipine (NORVASC) 2.5 MG tablet Take 2.5 mg by mouth daily.     buPROPion (WELLBUTRIN XL) 150 MG 24 hr tablet Take 1 tablet (150 mg total) by mouth daily. 30 tablet 5   buPROPion (WELLBUTRIN XL) 300 MG 24 hr tablet Take 1 tablet (300 mg total) by mouth daily. 30 tablet 5   chlorthalidone (HYGROTON) 25 MG tablet Take 25 mg by mouth every morning.     docusate sodium (COLACE) 100 MG capsule Take 1 capsule (100 mg total) by mouth daily as needed. 30 capsule 2   DULoxetine (CYMBALTA) 60 MG capsule Take 1 capsule (60 mg total) by mouth daily. 30 capsule 5   gabapentin (NEURONTIN) 800 MG tablet Take 800 mg by mouth in the morning, at noon, in the evening, and at bedtime. Reports taking 1-4 times daily, depending on symptoms.     losartan (COZAAR) 50 MG tablet Take 50 mg by mouth every evening.  0   methocarbamol (ROBAXIN) 500 MG tablet Take 1 tablet (500 mg total) by mouth 2 (two) times daily as needed. 20 tablet 0   Multiple Vitamin (MULTIVITAMIN WITH MINERALS) TABS tablet Take 1 tablet by mouth daily.     naltrexone (DEPADE) 50 MG tablet Take 1 tablet (50 mg total) by mouth daily. 30 tablet 2   predniSONE (STERAPRED UNI-PAK 21 TAB) 10 MG (21) TBPK tablet Take as directed 21 tablet 0   propranolol (INDERAL) 80 MG tablet Take 80 mg by mouth 2 (two) times daily.  2   rosuvastatin (CRESTOR) 5 MG  tablet Take 5 mg by mouth 3 (three) times a week.     SUMAtriptan (IMITREX) 50 MG tablet Take 1 tablet (50 mg total) by mouth every 2 (two) hours as needed. May repeat in 2 hours if headache persists or recurs. 12 tablet 6   telmisartan (MICARDIS) 40 MG tablet Take 40 mg by mouth daily.     tiZANidine (ZANAFLEX) 4 MG tablet Take 4 mg by mouth daily.  2   zolpidem (AMBIEN) 10 MG tablet TAKE 1 AND 1/4 TABLETS BY MOUTH AT BEDTIME AS NEEDED FOR SLEEP 38 tablet 1   No current facility-administered medications for this visit.    Medication Side Effects:  None  Allergies:  Allergies  Allergen Reactions   Lisinopril Anaphylaxis, Rash and Swelling    Other reaction(s): lip swelling   Sulfa Antibiotics Hives   Atorvastatin     Other reaction(s): myalgia   Rosanil Cleanser [Sulfacetamide Sodium-Sulfur]     Other reaction(s): hives   Tramadol     Other reaction(s): irritability   Varenicline     Other reaction(s): depression, headache Other reaction(s): depression, headache   Sulfamethoxazole Rash    Past Medical History:  Diagnosis Date   Anxiety    Chiari malformation type I (HCC)    Coronary artery disease    Depression    Head pain    Hypertension    Tinnitus     Past Medical History, Surgical history, Social history, and Family history were reviewed and updated as appropriate.   Please see review of systems for further details on the patient's review from today.   Objective:   Physical Exam:  There were no vitals taken for this visit.  Physical Exam Constitutional:      General: She is not in acute distress. Musculoskeletal:        General: No deformity.  Neurological:     Mental Status: She is alert and oriented to person, place, and time.     Coordination: Coordination normal.  Psychiatric:        Attention and Perception: Attention and perception normal. She does not perceive auditory or visual hallucinations.        Mood and Affect: Mood normal. Mood is not anxious or depressed. Affect is not labile, blunt, angry or inappropriate.        Speech: Speech normal.        Behavior: Behavior normal.        Thought Content: Thought content normal. Thought content is not paranoid or delusional. Thought content does not include homicidal or suicidal ideation. Thought content does not include homicidal or suicidal plan.        Cognition and Memory: Cognition and memory normal.        Judgment: Judgment normal.     Comments: Insight intact     Lab Review:     Component Value Date/Time   NA 135 05/08/2021 1409    K 3.7 05/08/2021 1409   CL 99 05/08/2021 1409   CO2 28 05/08/2021 1409   GLUCOSE 81 05/08/2021 1409   BUN 12 05/08/2021 1409   CREATININE 1.07 (H) 05/08/2021 1409   CALCIUM 9.7 05/08/2021 1409   GFRNONAA >60 05/08/2021 1409   GFRAA >60 07/01/2017 1710       Component Value Date/Time   WBC 6.2 07/01/2017 1710   RBC 4.10 07/01/2017 1710   HGB 13.3 07/01/2017 1710   HCT 40.5 07/01/2017 1710   PLT 212 07/01/2017 1710   MCV 98.8 07/01/2017 1710  MCH 32.4 07/01/2017 1710   MCHC 32.8 07/01/2017 1710   RDW 12.9 07/01/2017 1710    No results found for: "POCLITH", "LITHIUM"   No results found for: "PHENYTOIN", "PHENOBARB", "VALPROATE", "CBMZ"   .res Assessment: Plan:    Plan:  PDMP reviewed  1. Cymbalta 60mg  daily 2. Wellbutrin XL 450mg  daily - denies seizure history 3. Ambien 10mg  at hs 4. D/C Naltrexone 50mg  daily - did not work 5. Consider Antabuse 250mg    RTC 4 weeks   Patient advised to contact office with any questions, adverse effects, or acute worsening in signs and symptoms.  There are no diagnoses linked to this encounter.   Please see After Visit Summary for patient specific instructions.  Future Appointments  Date Time Provider Department Center  03/02/2022  4:00 PM , LCSW [facility] None  03/09/2022  4:00 PM , LCSW [facility] None  03/15/2022  4:00 PM 03/04/2022, LCSW [facility] None    No orders of the defined types were placed in this encounter.   -------------------------------

## 2022-02-16 NOTE — Progress Notes (Signed)
[facility] Counselor/Therapist Progress Note  Patient ID: Autumn Greene, MRN: [id number],    Date: 02/16/2022  Time Spent: 50 minutes   Treatment Type: Individual Therapy  Reported Symptoms: anxiety, depression, indecision, some tearfulness  Mental Status Exam:  Appearance:   Neat     Behavior:  Appropriate, Sharing, and Motivated  Motor:  Normal  Speech/Language:   Clear and Coherent  Affect:  Depressed, Tearful, and anxious  Mood:  anxious and depressed  Thought process:  goal directed  Thought content:    Overthinking and some obsessiveness  Sensory/Perceptual disturbances:    WNL  Orientation:  oriented to person, place, time/date, situation, day of week, month of year, year, and stated date of February 16, 2022  Attention:  Good  Concentration:  Good and Fair  Memory:  WNL  Fund of knowledge:   Good  Insight:    Good and Fair  Judgment:   Good and Fair  Impulse Control:  Good and Fair   Risk Assessment: Danger to Self:  No Self-injurious Behavior: No Danger to Others: No Duty to Warn:no Physical Aggression / Violence:No  Access to Firearms a concern: No  Gang Involvement:No   Subjective: Patient today reporting anxiety and some "increased on and off" and some depression regarding family interaction and decisions. Difficulties in the home with S.O.'s choices, but concerned about brother of SO and his limitations.  Tearfully explained some of the circumstances recently and also appropriate follow-up.  (Not all details included in this note due to patient privacy needs).  Patient was able to process her concerns more thoroughly in session, get support, as well as clarify what she feels are her next steps.  Still struggling with feelings of "not being good enough" but seemed to have more determination today to make better choices for herself.  States she has remained sober when others within the household have not and she felt more positive about that recently.   Recognizes that she has some positives and wants to capitalize on them and not let others pull her down.  Emphasizing healthier self-care and boundaries.  Interventions: Cognitive Behavioral Therapy  Treatment goal plan: Treatment goals remain on treatment plan as patient works with strategies to achieve her goals.  Progress is assessed each session and is documented in the "subject" and/or "note/progress" section of treatment note. Long-term goal: Reduce overall level, frequency, and intensity of the anxiety so that daily functioning is not impaired. Short-term goal: Verbalize an understanding of the role that fearful thinking plays in creating fears, excessive worry, and persistent anxiety symptoms. Strategies: Identify, challenge, and replace fearful self-talk with positive, realistic, and empowering self-talk.   Diagnosis:   ICD-10-CM   1. Generalized anxiety disorder  F41.1      Plan: Patient today reporting main symptoms as anxiety, frustration, difficulty making decisions, and depression.  Denies any SI.  As noted above she processed personal and family issues that are difficult for her to address and yet she wants to be out of her current situation.  Hard to set and keep healthier boundaries and we discussed this in more detail today and she was able to state some of the steps that she needs to take in order to be healthier herself.  Encouraged patient in practicing more positive behaviors including: Intentionally believing more in herself to make positive changes, make decisions and stick with them, stop assuming negatives, reduce her impulsivity, be in touch with people who are supportive of her, look  for positives daily, challenge and counteract her self doubt, reduce overthinking and over analyzing, healthy nutrition and exercise, positive self talk, saying no when she needs to say no, letting go of guilt from the past that can hold her back now, consider decisions she may currently  need to make for herself to be healthier mentally and emotionally, and realize the strength she shows working with goal directed behaviors to move in a direction that supports her improved emotional health and overall wellbeing.  Goal review and progress/challenges noted with patient.  Next appointment within 2 to 3 weeks.  This record has been created using [employer].  Chart creation errors have been sought, but may not always have been located and corrected.  Such creation errors do not reflect on the standard of medical care provided.   Mathis Fare, LCSW

## 2022-02-17 ENCOUNTER — Ambulatory Visit: Payer: 59 | Admitting: Psychiatry

## 2022-02-18 ENCOUNTER — Other Ambulatory Visit: Payer: Self-pay

## 2022-02-18 DIAGNOSIS — G4700 Insomnia, unspecified: Secondary | ICD-10-CM

## 2022-02-18 MED ORDER — ZOLPIDEM TARTRATE 10 MG PO TABS: ORAL_TABLET | ORAL | 1 refills | Status: DC

## 2022-02-24 ENCOUNTER — Encounter (INDEPENDENT_AMBULATORY_CARE_PROVIDER_SITE_OTHER): Payer: Self-pay

## 2022-02-27 ENCOUNTER — Other Ambulatory Visit: Payer: Self-pay | Admitting: Physician Assistant

## 2022-02-27 DIAGNOSIS — R300 Dysuria: Secondary | ICD-10-CM

## 2022-02-27 DIAGNOSIS — R10817 Generalized abdominal tenderness: Secondary | ICD-10-CM

## 2022-03-01 ENCOUNTER — Ambulatory Visit (INDEPENDENT_AMBULATORY_CARE_PROVIDER_SITE_OTHER): Payer: 59

## 2022-03-01 DIAGNOSIS — R109 Unspecified abdominal pain: Secondary | ICD-10-CM | POA: Diagnosis not present

## 2022-03-01 DIAGNOSIS — R10817 Generalized abdominal tenderness: Secondary | ICD-10-CM | POA: Diagnosis not present

## 2022-03-01 DIAGNOSIS — R300 Dysuria: Secondary | ICD-10-CM

## 2022-03-01 MED ORDER — IOHEXOL 300 MG/ML  SOLN
100.0000 mL | Freq: Once | INTRAMUSCULAR | Status: AC | PRN
  Administered 2022-03-01: 100 mL via INTRAVENOUS

## 2022-03-02 ENCOUNTER — Ambulatory Visit: Payer: 59 | Admitting: Psychiatry

## 2022-03-09 ENCOUNTER — Ambulatory Visit (INDEPENDENT_AMBULATORY_CARE_PROVIDER_SITE_OTHER): Payer: 59 | Admitting: Psychiatry

## 2022-03-09 DIAGNOSIS — F411 Generalized anxiety disorder: Secondary | ICD-10-CM | POA: Diagnosis not present

## 2022-03-09 NOTE — Progress Notes (Signed)
[facility] Counselor/Therapist Progress Note  Patient ID: Autumn Greene, MRN: [id number],    Date: 03/09/2022  Time Spent: 58 minutes   Treatment Type: Individual Therapy  Reported Symptoms: anxiety "through the roof"  Mental Status Exam:  Appearance:   Casual     Behavior:  Appropriate, Sharing, and Motivated  Motor:  Normal  Speech/Language:   Clear and Coherent  Affect:  anxiety  Mood:  anxious  Thought process:  goal directed  Thought content:    Some obsessive thoughts and overthinking  Sensory/Perceptual disturbances:    WNL  Orientation:  oriented to person, place, time/date, situation, day of week, month of year, year, and stated date of March 09, 2022  Attention:  Good  Concentration:  Good and Fair  Memory:  WNL  Fund of knowledge:   Good  Insight:    Good and Fair  Judgment:   Good  Impulse Control:  Good   Risk Assessment: Danger to Self:  No Self-injurious Behavior: No Danger to Others: No Duty to Warn:no Physical Aggression / Violence:No  Access to Firearms a concern: No  Gang Involvement:No   Subjective:   Patient in today reporting anxiety "through the roof" and using food for comfort which has led to some weight gain. States I'm 41"1" and weigh 185. Has lost weight before through a "weight loss center" injections weekly. Stress increased at home with person with whom she lives and she describes her concerns for herself and person's adult brother. Struggling with her living arrangements and not enough money to move currently but is looking for another place that she can afford and also help in care of person she with which she is involved. (Not all details included in this note due to patient privacy needs.) Difficulty in home situation with female friend. Talked today with a "support person for adult person about which she is concerned." They are helping patient with a plan for patient to have some support with a situation within the home and  patient seems to feel more positive about that.  Still struggling with feelings of "not being good enough" but is also showing some pro activity and working on this between sessions and during session.  Reports her continuing to remain sober even though others within the household drink to excess.  Would like to be in a better environment but knows right now she cannot afford it financially.  Trying to capitalize on the positives that she does see within herself and concentrate on trying to be healthier including having healthier boundaries, and continuing goal directed behaviors.  Interventions: Cognitive Behavioral Therapy and Ego-Supportive  Treatment goal plan: Treatment goals remain on treatment plan as patient works with strategies to achieve her goals.  Progress is assessed each session and is documented in the "subject" and/or "note/progress" section of treatment note. Long-term goal: Reduce overall level, frequency, and intensity of the anxiety so that daily functioning is not impaired. Short-term goal: Verbalize an understanding of the role that fearful thinking plays in creating fears, excessive worry, and persistent anxiety symptoms. Strategies: Identify, challenge, and replace fearful self-talk with positive, realistic, and empowering self-talk.  Diagnosis:   ICD-10-CM   1. Generalized anxiety disorder  F41.1      Plan:  Patient today showing good participation and motivation in session as she reports symptoms of anxiety "through the roof".  Stressors at home have increased is involved with resource in the community to help some of the situations she is  experiencing at home.  (Not all details included in this note due to patient privacy issues.)  Did leave session today after having talked through a lot of her frustrations and anxieties, feeling supported and also having a little more of a plan in place in terms of working with a community agency for some assistance.  Tired, exhausted,  and commits to taking better care of herself in terms of getting enough sleep at night, eating healthier, staying hydrated, and so being in touch with people that are supportive for her. Encouraged patient in her practice of more positive behaviors including: Making more positive decisions and stick with them, believing in herself more and that she can make positive changes, stop assuming negatives, reduce her impulsivity, be in touch with people who are supportive of her, see the positives in each day, see the positives within herself, challenge and counteract her self doubt, reduce overthinking and over analyzing, healthy nutrition and exercise, positive self talk, saying no when she needs to say no, letting go of guilt from the past that can hold her back, consider decisions she may currently need to make for herself to be healthier mentally and emotionally, and recognize the strength she shows working with goal directed behaviors to move in a direction that supports her improved emotional health and self-esteem.  Goal review and progress/challenges noted with patient.  Next appointment within 3 weeks.  This record has been created using [employer].  Chart creation errors have been sought, but may not always have been located and corrected.  Such creation errors do not reflect on the standard of medical care provided.   Mathis Fare, LCSW

## 2022-03-12 ENCOUNTER — Ambulatory Visit: Payer: 59 | Admitting: Adult Health

## 2022-03-15 ENCOUNTER — Ambulatory Visit: Payer: 59 | Admitting: Psychiatry

## 2022-03-18 ENCOUNTER — Ambulatory Visit: Payer: 59 | Admitting: Adult Health

## 2022-03-25 DIAGNOSIS — N6314 Unspecified lump in the right breast, lower inner quadrant: Secondary | ICD-10-CM | POA: Diagnosis not present

## 2022-03-25 DIAGNOSIS — N6313 Unspecified lump in the right breast, lower outer quadrant: Secondary | ICD-10-CM | POA: Diagnosis not present

## 2022-04-05 ENCOUNTER — Ambulatory Visit (INDEPENDENT_AMBULATORY_CARE_PROVIDER_SITE_OTHER): Payer: 59 | Admitting: Adult Health

## 2022-04-05 ENCOUNTER — Encounter: Payer: Self-pay | Admitting: Adult Health

## 2022-04-05 DIAGNOSIS — G4700 Insomnia, unspecified: Secondary | ICD-10-CM

## 2022-04-05 DIAGNOSIS — F4010 Social phobia, unspecified: Secondary | ICD-10-CM

## 2022-04-05 DIAGNOSIS — F428 Other obsessive-compulsive disorder: Secondary | ICD-10-CM

## 2022-04-05 DIAGNOSIS — F411 Generalized anxiety disorder: Secondary | ICD-10-CM

## 2022-04-05 DIAGNOSIS — F331 Major depressive disorder, recurrent, moderate: Secondary | ICD-10-CM | POA: Diagnosis not present

## 2022-04-05 DIAGNOSIS — F1010 Alcohol abuse, uncomplicated: Secondary | ICD-10-CM | POA: Diagnosis not present

## 2022-04-05 DIAGNOSIS — R69 Illness, unspecified: Secondary | ICD-10-CM | POA: Diagnosis not present

## 2022-04-05 NOTE — Progress Notes (Signed)
Autumn Greene [id number] 1966/01/27 56 y.o.  Virtual Visit via Telephone Note  I connected with pt on 04/05/22 at  5:20 PM EDT by telephone and verified that I am speaking with the correct person using two identifiers.   I discussed the limitations, risks, security and privacy concerns of performing an evaluation and management service by telephone and the availability of in person appointments. I also discussed with the patient that there may be a patient responsible charge related to this service. The patient expressed understanding and agreed to proceed.   I discussed the assessment and treatment plan with the patient. The patient was provided an opportunity to ask questions and all were answered. The patient agreed with the plan and demonstrated an understanding of the instructions.   The patient was advised to call back or seek an in-person evaluation if the symptoms worsen or if the condition fails to improve as anticipated.  I provided 25 minutes of non-face-to-face time during this encounter.  The patient was located at home.  The provider was located at [place].   Aloha Gell, NP   Subjective:   Patient ID:  Autumn Greene is a 56 y.o. (DOB January 28, 1966) female.  Chief Complaint: No chief complaint on file.   HPI Autumn Greene presents for follow-up of MDD, GAD, SAD, insomnia, alcohol abuse, and obsessional thoughts.   Describes mood today as "ok". Pleasant. Mood symptoms - reports some depression, anxiety, and irritability. Reports panic attacks - "it's the unknown". Reports mood fluctuations. Stating "some days are better than others". She and husband having ongoing marital issues. Starting a new job in October. Stating "I'm keeping myself busy with the grand kids". Saw Rinaldo Cloud today for therapy. Decreased interest and motivation. Taking medications as prescribed. Energy levels vary. Active, does not have a regular exercise routine.  Enjoys  some usual interests and activities. Lives with husband, brother in law. Spending time with family.  Appetite adequate. Weight gain - 182 to 184 pounds.  Sleeps better some nights than others. Sleeps 6 or more hours Focus and concentration improved - able to stream line when needed. Completing tasks. Managing some aspects of household. 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 Denies SI or HI.  Denies AH or VH. Denies self harm.  Decreased alcohol use.   Review of Systems:  Review of Systems  Musculoskeletal:  Negative for gait problem.  Neurological:  Negative for tremors.  Psychiatric/Behavioral:         Please refer to HPI    Medications: I have reviewed the patient's current medications.  Current Outpatient Medications  Medication Sig Dispense Refill   amLODipine (NORVASC) 2.5 MG tablet Take 2.5 mg by mouth daily.     buPROPion (WELLBUTRIN XL) 150 MG 24 hr tablet Take 1 tablet (150 mg total) by mouth daily. 30 tablet 5   buPROPion (WELLBUTRIN XL) 300 MG 24 hr tablet Take 1 tablet (300 mg total) by mouth daily. 30 tablet 5   chlorthalidone (HYGROTON) 25 MG tablet Take 25 mg by mouth every morning.     docusate sodium (COLACE) 100 MG capsule Take 1 capsule (100 mg total) by mouth daily as needed. 30 capsule 2   DULoxetine (CYMBALTA) 60 MG capsule Take 1 capsule (60 mg total) by mouth daily. 30 capsule 5   gabapentin (NEURONTIN) 800 MG tablet Take 800 mg by mouth in the  morning, at noon, in the evening, and at bedtime. Reports taking 1-4 times daily, depending on symptoms.     losartan (COZAAR) 50 MG tablet Take 50 mg by mouth every evening.  0   methocarbamol (ROBAXIN) 500 MG tablet Take 1 tablet (500 mg total) by mouth 2 (two) times daily as needed. 20 tablet 0   Multiple Vitamin (MULTIVITAMIN WITH MINERALS) TABS tablet Take 1 tablet by mouth daily.     predniSONE (STERAPRED  UNI-PAK 21 TAB) 10 MG (21) TBPK tablet Take as directed 21 tablet 0   propranolol (INDERAL) 80 MG tablet Take 80 mg by mouth 2 (two) times daily.  2   rosuvastatin (CRESTOR) 5 MG tablet Take 5 mg by mouth 3 (three) times a week.     SUMAtriptan (IMITREX) 50 MG tablet Take 1 tablet (50 mg total) by mouth every 2 (two) hours as needed. May repeat in 2 hours if headache persists or recurs. 12 tablet 6   telmisartan (MICARDIS) 40 MG tablet Take 40 mg by mouth daily.     tiZANidine (ZANAFLEX) 4 MG tablet Take 4 mg by mouth daily.  2   zolpidem (AMBIEN) 10 MG tablet TAKE 1 AND 1/4 TABLETS BY MOUTH AT BEDTIME AS NEEDED FOR SLEEP 38 tablet 1   No current facility-administered medications for this visit.    Medication Side Effects: None  Allergies:  Allergies  Allergen Reactions   Lisinopril Anaphylaxis, Rash and Swelling    Other reaction(s): lip swelling   Sulfa Antibiotics Hives   Atorvastatin     Other reaction(s): myalgia   Rosanil Cleanser [Sulfacetamide Sodium-Sulfur]     Other reaction(s): hives   Tramadol     Other reaction(s): irritability   Varenicline     Other reaction(s): depression, headache Other reaction(s): depression, headache   Sulfamethoxazole Rash    Past Medical History:  Diagnosis Date   Anxiety    Chiari malformation type I (HCC)    Coronary artery disease    Depression    Head pain    Hypertension    Tinnitus     Family History  Problem Relation Age of Onset   Hypertension Mother    Hyperlipidemia Mother    Heart disease Father     Social History   Socioeconomic History   Marital status: Divorced    Spouse name: Not on file   Number of children: 4   Years of education: college   Highest education level: Bachelor's degree (e.g., BA, AB, BS)  Occupational History   Occupation: Caregiver  Tobacco Use   Smoking status: Every Day    Packs/day: 0.25    Types: Cigarettes   Smokeless tobacco: Never  Vaping Use   Vaping Use: Every day   Substance and Sexual Activity   Alcohol use: Yes    Comment: occaional   Drug use: No   Sexual activity: Yes    Birth control/protection: Surgical  Other Topics Concern   Not on file  Social History Narrative   6 cups caffeine per day (Coke Zero).   Lives with significant other and his brother.   Right-handed.    Social Determinants of Health   Financial Resource Strain: Not on file  Food Insecurity: Not on file  Transportation Needs: Not on file  Physical Activity: Not on file  Stress: Not on file  Social Connections: Not on file  Intimate Partner Violence: Not on file    Past Medical History, Surgical history, Social history, and Family history were  reviewed and updated as appropriate.   Please see review of systems for further details on the patient's review from today.   Objective:   Physical Exam:  There were no vitals taken for this visit.  Physical Exam Constitutional:      General: She is not in acute distress. Musculoskeletal:        General: No deformity.  Neurological:     Mental Status: She is alert and oriented to person, place, and time.     Coordination: Coordination normal.  Psychiatric:        Attention and Perception: Attention and perception normal. She does not perceive auditory or visual hallucinations.        Mood and Affect: Mood normal. Mood is not anxious or depressed. Affect is not labile, blunt, angry or inappropriate.        Speech: Speech normal.        Behavior: Behavior normal.        Thought Content: Thought content normal. Thought content is not paranoid or delusional. Thought content does not include homicidal or suicidal ideation. Thought content does not include homicidal or suicidal plan.        Cognition and Memory: Cognition and memory normal.        Judgment: Judgment normal.     Comments: Insight intact     Lab Review:     Component Value Date/Time   NA 135 05/08/2021 1409   K 3.7 05/08/2021 1409   CL 99 05/08/2021  1409   CO2 28 05/08/2021 1409   GLUCOSE 81 05/08/2021 1409   BUN 12 05/08/2021 1409   CREATININE 1.07 (H) 05/08/2021 1409   CALCIUM 9.7 05/08/2021 1409   GFRNONAA >60 05/08/2021 1409   GFRAA >60 07/01/2017 1710       Component Value Date/Time   WBC 6.2 07/01/2017 1710   RBC 4.10 07/01/2017 1710   HGB 13.3 07/01/2017 1710   HCT 40.5 07/01/2017 1710   PLT 212 07/01/2017 1710   MCV 98.8 07/01/2017 1710   MCH 32.4 07/01/2017 1710   MCHC 32.8 07/01/2017 1710   RDW 12.9 07/01/2017 1710    No results found for: "POCLITH", "LITHIUM"   No results found for: "PHENYTOIN", "PHENOBARB", "VALPROATE", "CBMZ"   .res Assessment: Plan:    Plan:  PDMP reviewed  1. Cymbalta 60mg  daily 2. Wellbutrin XL 450mg  daily - denies seizure history 3. Ambien 10mg  at hs  RTC 3 months  Patient advised to contact office with any questions, adverse effects, or acute worsening in signs and symptoms.   There are no diagnoses linked to this encounter.  Please see After Visit Summary for patient specific instructions.  Future Appointments  Date Time Provider Natalia  04/13/2022  3:00 PM Shanon Ace, LCSW [facility] None    No orders of the defined types were placed in this encounter.     -------------------------------

## 2022-04-11 ENCOUNTER — Other Ambulatory Visit: Payer: Self-pay | Admitting: Physician Assistant

## 2022-04-13 ENCOUNTER — Ambulatory Visit: Payer: 59 | Admitting: Psychiatry

## 2022-04-21 ENCOUNTER — Other Ambulatory Visit: Payer: Self-pay | Admitting: Adult Health

## 2022-04-21 DIAGNOSIS — G4700 Insomnia, unspecified: Secondary | ICD-10-CM

## 2022-07-07 ENCOUNTER — Ambulatory Visit (INDEPENDENT_AMBULATORY_CARE_PROVIDER_SITE_OTHER): Payer: 59 | Admitting: Adult Health

## 2022-07-07 DIAGNOSIS — F489 Nonpsychotic mental disorder, unspecified: Secondary | ICD-10-CM

## 2022-07-07 NOTE — Progress Notes (Signed)
Patient no show appointment. ? ?

## 2022-07-07 NOTE — Progress Notes (Deleted)
Autumn Greene [id number] 07/27/1965 56 y.o.  Virtual Visit via Telephone Note  I connected with pt on 07/07/22 at  1:20 PM EST by telephone and verified that I am speaking with the correct person using two identifiers.   I discussed the limitations, risks, security and privacy concerns of performing an evaluation and management service by telephone and the availability of in person appointments. I also discussed with the patient that there may be a patient responsible charge related to this service. The patient expressed understanding and agreed to proceed.   I discussed the assessment and treatment plan with the patient. The patient was provided an opportunity to ask questions and all were answered. The patient agreed with the plan and demonstrated an understanding of the instructions.   The patient was advised to call back or seek an in-person evaluation if the symptoms worsen or if the condition fails to improve as anticipated.  I provided 25 minutes of non-face-to-face time during this encounter.  The patient was located at home.  The provider was located at [facility].   Dorothyann Gibbs, NP   Subjective:   Patient ID:  Autumn Greene is a 56 y.o. (DOB 29-Apr-1966) female.  Chief Complaint: No chief complaint on file.   HPI Marleena Shubert Froberg presents for follow-up of MDD, GAD, SAD, insomnia, alcohol abuse, and obsessional thoughts.   Describes mood today as "ok". Pleasant. Mood symptoms - reports some depression, anxiety, and irritability. Reports panic attacks - "it's the unknown". Reports mood fluctuations. Stating "some days are better than others". She and husband having ongoing marital issues. Starting a new job in October. Stating "I'm keeping myself busy with the grand kids". Saw Rockne Menghini today for therapy. Decreased interest and motivation. Taking medications as prescribed. Energy levels vary. Active, does not have a regular exercise routine.  Enjoys  some usual interests and activities. Lives with husband, brother in law. Spending time with family.  Appetite adequate. Weight gain - 182 to 184 pounds.  Sleeps better some nights than others. Sleeps 6 or more hours Focus and concentration improved - able to stream line when needed. Completing tasks. Managing some aspects of household. 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 Denies SI or HI.  Denies AH or VH. Denies self harm.  Decreased alcohol use.    Review of Systems:  Review of Systems  Musculoskeletal:  Negative for gait problem.  Neurological:  Negative for tremors.  Psychiatric/Behavioral:         Please refer to HPI    Medications: I have reviewed the patient's current medications.  Current Outpatient Medications  Medication Sig Dispense Refill   amLODipine (NORVASC) 2.5 MG tablet Take 2.5 mg by mouth daily.     buPROPion (WELLBUTRIN XL) 150 MG 24 hr tablet Take 1 tablet (150 mg total) by mouth daily. 30 tablet 5   buPROPion (WELLBUTRIN XL) 300 MG 24 hr tablet Take 1 tablet (300 mg total) by mouth daily. 30 tablet 5   chlorthalidone (HYGROTON) 25 MG tablet Take 25 mg by mouth every morning.     DULoxetine (CYMBALTA) 60 MG capsule Take 1 capsule (60 mg total) by mouth daily. 30 capsule 5   gabapentin (NEURONTIN) 800 MG tablet Take 800 mg by mouth in the morning, at noon, in the evening, and at bedtime. Reports taking 1-4 times daily, depending on symptoms.  losartan (COZAAR) 50 MG tablet Take 50 mg by mouth every evening.  0   methocarbamol (ROBAXIN) 500 MG tablet Take 1 tablet (500 mg total) by mouth 2 (two) times daily as needed. 20 tablet 0   Multiple Vitamin (MULTIVITAMIN WITH MINERALS) TABS tablet Take 1 tablet by mouth daily.     predniSONE (STERAPRED UNI-PAK 21 TAB) 10 MG (21) TBPK tablet Take as directed 21 tablet 0   propranolol (INDERAL) 80 MG tablet Take 80  mg by mouth 2 (two) times daily.  2   rosuvastatin (CRESTOR) 5 MG tablet Take 5 mg by mouth 3 (three) times a week.     SUMAtriptan (IMITREX) 50 MG tablet Take 1 tablet (50 mg total) by mouth every 2 (two) hours as needed. May repeat in 2 hours if headache persists or recurs. 12 tablet 6   telmisartan (MICARDIS) 40 MG tablet Take 40 mg by mouth daily.     tiZANidine (ZANAFLEX) 4 MG tablet Take 4 mg by mouth daily.  2   zolpidem (AMBIEN) 10 MG tablet TAKE 1 AND 1/4 TABLETS BY MOUTH AT BEDTIME AS NEEDED FOR SLEEP 38 tablet 1   No current facility-administered medications for this visit.    Medication Side Effects: None  Allergies:  Allergies  Allergen Reactions   Lisinopril Anaphylaxis, Rash and Swelling    Other reaction(s): lip swelling   Sulfa Antibiotics Hives   Atorvastatin     Other reaction(s): myalgia   Rosanil Cleanser [Sulfacetamide Sodium-Sulfur]     Other reaction(s): hives   Tramadol     Other reaction(s): irritability   Varenicline     Other reaction(s): depression, headache Other reaction(s): depression, headache   Sulfamethoxazole Rash    Past Medical History:  Diagnosis Date   Anxiety    Chiari malformation type I (HCC)    Coronary artery disease    Depression    Head pain    Hypertension    Tinnitus     Family History  Problem Relation Age of Onset   Hypertension Mother    Hyperlipidemia Mother    Heart disease Father     Social History   Socioeconomic History   Marital status: Divorced    Spouse name: Not on file   Number of children: 4   Years of education: college   Highest education level: Bachelor's degree (e.g., BA, AB, BS)  Occupational History   Occupation: Caregiver  Tobacco Use   Smoking status: Every Day    Packs/day: 0.25    Types: Cigarettes   Smokeless tobacco: Never  Vaping Use   Vaping Use: Every day  Substance and Sexual Activity   Alcohol use: Yes    Comment: occaional   Drug use: No   Sexual activity: Yes     Birth control/protection: Surgical  Other Topics Concern   Not on file  Social History Narrative   6 cups caffeine per day (Coke Zero).   Lives with significant other and his brother.   Right-handed.    Social Determinants of Health   Financial Resource Strain: Not on file  Food Insecurity: Not on file  Transportation Needs: Not on file  Physical Activity: Not on file  Stress: Not on file  Social Connections: Not on file  Intimate Partner Violence: Not on file    Past Medical History, Surgical history, Social history, and Family history were reviewed and updated as appropriate.   Please see review of systems for further details on the patient's review from today.  Objective:   Physical Exam:  There were no vitals taken for this visit.  Physical Exam Neurological:     Mental Status: She is alert and oriented to person, place, and time.     Cranial Nerves: No dysarthria.  Psychiatric:        Attention and Perception: Attention and perception normal.        Mood and Affect: Mood normal.        Speech: Speech normal.        Behavior: Behavior is cooperative.        Thought Content: Thought content normal. Thought content is not paranoid or delusional. Thought content does not include homicidal or suicidal ideation. Thought content does not include homicidal or suicidal plan.        Cognition and Memory: Cognition and memory normal.        Judgment: Judgment normal.     Comments: Insight intact     Lab Review:     Component Value Date/Time   NA 135 05/08/2021 1409   K 3.7 05/08/2021 1409   CL 99 05/08/2021 1409   CO2 28 05/08/2021 1409   GLUCOSE 81 05/08/2021 1409   BUN 12 05/08/2021 1409   CREATININE 1.07 (H) 05/08/2021 1409   CALCIUM 9.7 05/08/2021 1409   GFRNONAA >60 05/08/2021 1409   GFRAA >60 07/01/2017 1710       Component Value Date/Time   WBC 6.2 07/01/2017 1710   RBC 4.10 07/01/2017 1710   HGB 13.3 07/01/2017 1710   HCT 40.5 07/01/2017 1710   PLT  212 07/01/2017 1710   MCV 98.8 07/01/2017 1710   MCH 32.4 07/01/2017 1710   MCHC 32.8 07/01/2017 1710   RDW 12.9 07/01/2017 1710    No results found for: "POCLITH", "LITHIUM"   No results found for: "PHENYTOIN", "PHENOBARB", "VALPROATE", "CBMZ"   .res Assessment: Plan:    Plan:  PDMP reviewed  1. Cymbalta 60mg  daily 2. Wellbutrin XL 450mg  daily - denies seizure history 3. Ambien 10mg  at hs  RTC 3 months  Patient advised to contact office with any questions, adverse effects, or acute worsening in signs and symptoms.  There are no diagnoses linked to this encounter.  Please see After Visit Summary for patient specific instructions.  Future Appointments  Date Time Provider Department Center  07/07/2022  1:20 PM Lajoy Vanamburg, , NP [facility] None    No orders of the defined types were placed in this encounter.     -------------------------------

## 2022-07-21 ENCOUNTER — Telehealth: Payer: Self-pay | Admitting: Adult Health

## 2022-07-21 DIAGNOSIS — G4700 Insomnia, unspecified: Secondary | ICD-10-CM

## 2022-07-22 NOTE — Telephone Encounter (Signed)
Please schedule appt

## 2022-07-22 NOTE — Telephone Encounter (Signed)
No show on 12/20

## 2022-07-29 NOTE — Telephone Encounter (Signed)
LVM to call and schedule appt

## 2022-08-22 ENCOUNTER — Other Ambulatory Visit: Payer: Self-pay | Admitting: Adult Health

## 2022-08-22 DIAGNOSIS — F331 Major depressive disorder, recurrent, moderate: Secondary | ICD-10-CM

## 2022-08-22 DIAGNOSIS — F411 Generalized anxiety disorder: Secondary | ICD-10-CM

## 2022-08-23 NOTE — Telephone Encounter (Signed)
Needs appt. Have already asked Adm to call and schedule with another RF request.

## 2022-08-23 NOTE — Telephone Encounter (Signed)
Please call to schedule an appt, past due 

## 2022-08-24 NOTE — Telephone Encounter (Signed)
Called Pt. She moved to [facility]. Will check with insurance for virtual coverage and called back to schedule.

## 2022-09-20 ENCOUNTER — Other Ambulatory Visit: Payer: Self-pay | Admitting: Adult Health

## 2022-09-20 DIAGNOSIS — F331 Major depressive disorder, recurrent, moderate: Secondary | ICD-10-CM

## 2022-09-20 DIAGNOSIS — F411 Generalized anxiety disorder: Secondary | ICD-10-CM

## 2022-10-02 ENCOUNTER — Other Ambulatory Visit: Payer: Self-pay | Admitting: Adult Health

## 2022-10-02 DIAGNOSIS — F331 Major depressive disorder, recurrent, moderate: Secondary | ICD-10-CM

## 2022-11-15 ENCOUNTER — Telehealth: Payer: Self-pay

## 2022-11-15 NOTE — Telephone Encounter (Signed)
Pt needs to schedule apt, last visit 03/2022,  Will need a PA with Rx Benefits for her Zolpidem 10 mg #38/30 day

## 2023-08-17 IMAGING — CR DG CHEST 2V
2 series · 2 of 2 positions shown · non-contrast
Comparison: Chest x-ray 11/18/2020.

CLINICAL DATA: 55-year-old female with history of productive cough
and bilateral chest tightness for the past 2 weeks.

EXAM:
CHEST - 2 VIEW

[w chest pa]
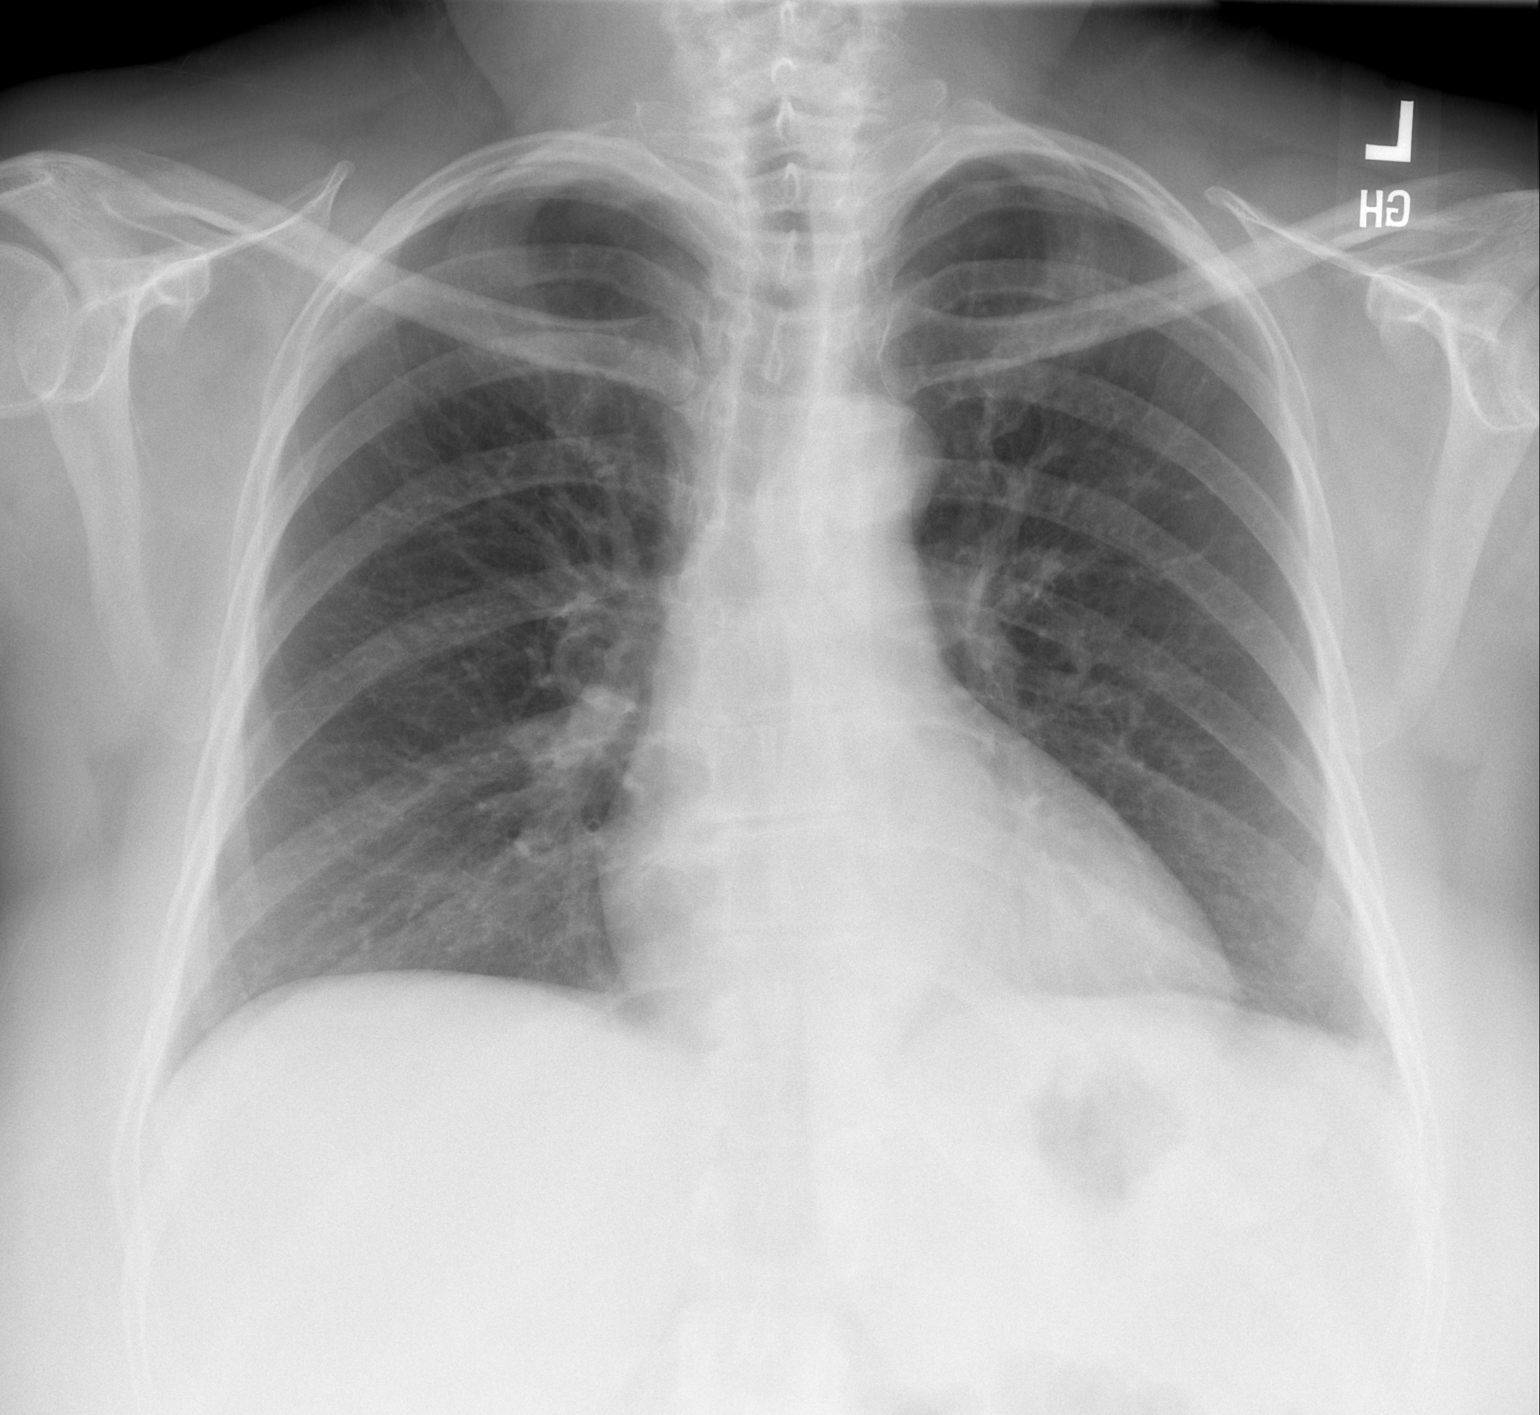

[w chest lat]
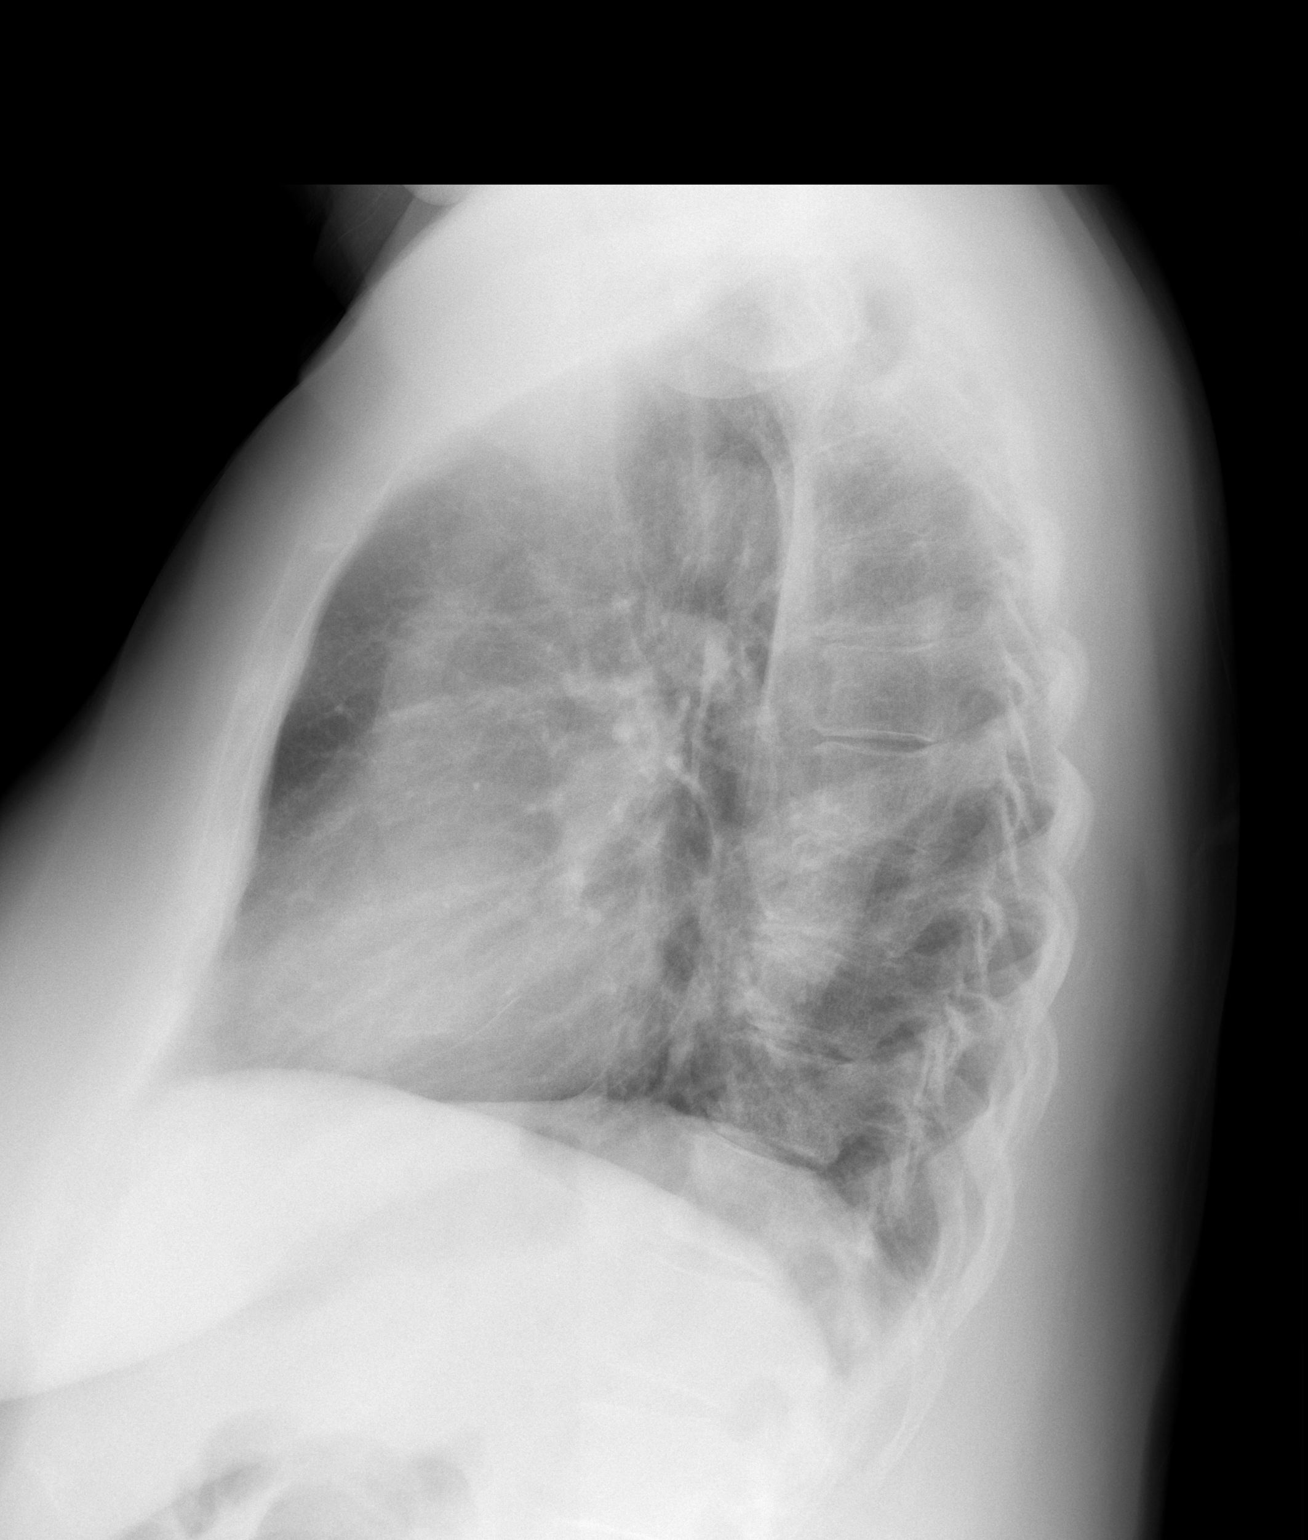

[2 of 2 positions shown; findings below may reference images not displayed]

FINDINGS: Lung volumes are normal. No consolidative airspace disease. No
pleural effusions. No pneumothorax. No pulmonary nodule or mass
noted. Pulmonary vasculature and the cardiomediastinal silhouette
are within normal limits.
IMPRESSION: No radiographic evidence of acute cardiopulmonary disease.

## 2024-02-03 ENCOUNTER — Encounter: Payer: Self-pay | Admitting: Advanced Practice Midwife
